# Patient Record
Sex: Female | Born: 1995 | Race: Black or African American | Hispanic: No | Marital: Single | Smoking: Never smoker
Health system: Southern US, Community
[De-identification: ages and names within clinical notes are randomized; demographics above are authoritative.]

## PROBLEM LIST (undated history)

## (undated) DIAGNOSIS — N289 Disorder of kidney and ureter, unspecified: Secondary | ICD-10-CM

## (undated) DIAGNOSIS — B009 Herpesviral infection, unspecified: Secondary | ICD-10-CM

## (undated) HISTORY — PX: BREAST SURGERY: SHX581

## (undated) HISTORY — PX: OTHER SURGICAL HISTORY: SHX169

---
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
## 2015-10-31 ENCOUNTER — Ambulatory Visit (HOSPITAL_COMMUNITY)
Admission: EM | Admit: 2015-10-31 | Discharge: 2015-10-31 | Disposition: A | Discharge status: Home / Self Care | Payer: Managed Care, Other (non HMO) | Attending: Emergency Medicine | Admitting: Emergency Medicine

## 2015-10-31 ENCOUNTER — Encounter (HOSPITAL_COMMUNITY): Payer: Self-pay | Admitting: Emergency Medicine

## 2015-10-31 DIAGNOSIS — J069 Acute upper respiratory infection, unspecified: Secondary | ICD-10-CM

## 2015-10-31 DIAGNOSIS — B9789 Other viral agents as the cause of diseases classified elsewhere: Principal | ICD-10-CM

## 2015-10-31 DIAGNOSIS — R6889 Other general symptoms and signs: Secondary | ICD-10-CM | POA: Diagnosis not present

## 2015-10-31 MED ORDER — HYDROCODONE-HOMATROPINE 5-1.5 MG/5ML PO SYRP: 5.0000 mL | ORAL_SOLUTION | Freq: Four times a day (QID) | ORAL | Status: DC | PRN

## 2015-10-31 NOTE — ED Notes (Signed)
Patient c/o productive cough, hot and cold chills, and bodyaches x 2 days. Patient reports she has been taking theraflu and oTC cold medicine with no relief. Patient is in NAD.

## 2015-10-31 NOTE — ED Provider Notes (Signed)
CSN: [id number]     Arrival date & time 10/31/15  1406 History   First MD Initiated Contact with Patient 10/31/15 [phone]     Chief Complaint  Patient presents with  . URI   (Consider location/radiation/quality/duration/timing/severity/associated sxs/prior Treatment) HPI  She is a 20 year old woman here for evaluation of cough and body aches.  Her symptoms started 2 days ago with sore throat. Of the last 2 days she has developed headaches, body aches, sinus pressure, nasal congestion, and cough. She denies any wheezing or shortness of breath during the day, but states she will feel a little short of breath at night. Decreased appetite, but no nausea or vomiting. She did take ibuprofen today around noon. She did have contact with the sick relative recently.    Past Medical History  Diagnosis Date  . Renal disorder     kidney stone   History reviewed. No pertinent past surgical history. History reviewed. No pertinent family history. Social History  Substance Use Topics  . Smoking status: Never Smoker   . Smokeless tobacco: None  . Alcohol Use: No   OB History    No data available     Review of Systems As in history of present illness Allergies  Review of patient's allergies indicates no known allergies.  Home Medications   Prior to Admission medications   Medication Sig Start Date End Date Taking? Authorizing Provider  HYDROcodone-homatropine (HYCODAN) 5-1.5 MG/5ML syrup Take 5 mLs by mouth every 6 (six) hours as needed for cough. 10/31/15   Charm RingsErin J Avontae Burkhead, MD  lisdexamfetamine (VYVANSE) 30 MG capsule Take 30 mg by mouth daily as needed.     Historical Provider, MD   Meds Ordered and Administered this Visit  Medications - No data to display  BP 113/64 mmHg  Pulse 115  Temp(Src) 100.4 F (38 C) (Oral)  Resp 18  SpO2 100%  LMP 10/01/2015 (Approximate) No data found.   Physical Exam  Constitutional: She is oriented to person, place, and time. She appears well-developed and  well-nourished. No distress.  Appears tired and fatigued  HENT:  Mouth/Throat: Oropharynx is clear and moist. No oropharyngeal exudate.  Nasal mucosa is erythematous. TMs normal bilaterally.  Neck: Neck supple.  Cardiovascular: Regular rhythm and normal heart sounds.   No murmur heard. Mild tachycardia  Pulmonary/Chest: Effort normal and breath sounds normal. No respiratory distress. She has no wheezes. She has no rales.  Lymphadenopathy:    She has cervical adenopathy (left anterior chain).  Neurological: She is alert and oriented to person, place, and time.    ED Course  Procedures (including critical care time)  Labs Review Labs Reviewed - No data to display  Imaging Review No results found.   MDM   1. Viral URI with cough   2. Flu-like symptoms    Likely a flulike illness. Doubt flu at this time. Symptomatic treatment with rest, fluids, OTC allergy medication. Prescription given for Hycodan to use for cough. School note provided. Follow-up if symptoms worsen or do not improve in the next 2 days.    Charm RingsErin J Nekeya Briski, MD 10/31/15 1538

## 2015-10-31 NOTE — Discharge Instructions (Signed)
You have a flulike virus. This is likely not the flu itself. Get plenty of rest and drink lots of fluids. Given over-the-counter allergy medicine such as Zyrtec or Claritin to help with the congestion. Use Hycodan every 4-6 hours as needed for cough. Do not drive while taking this medicine. Ideally, you are not around people for the next 36-48 hours. If your symptoms are not improving by Thursday or you develop worsening shortness of breath, please come back for recheck.

## 2016-03-18 ENCOUNTER — Encounter (HOSPITAL_COMMUNITY): Payer: Self-pay | Admitting: Emergency Medicine

## 2016-03-18 ENCOUNTER — Ambulatory Visit (HOSPITAL_COMMUNITY)
Admission: EM | Admit: 2016-03-18 | Discharge: 2016-03-18 | Disposition: A | Discharge status: Home / Self Care | Payer: Managed Care, Other (non HMO) | Attending: Family Medicine | Admitting: Family Medicine

## 2016-03-18 DIAGNOSIS — J029 Acute pharyngitis, unspecified: Secondary | ICD-10-CM | POA: Insufficient documentation

## 2016-03-18 DIAGNOSIS — J069 Acute upper respiratory infection, unspecified: Secondary | ICD-10-CM

## 2016-03-18 DIAGNOSIS — R52 Pain, unspecified: Secondary | ICD-10-CM | POA: Insufficient documentation

## 2016-03-18 LAB — POCT RAPID STREP A: Streptococcus, Group A Screen (Direct): NEGATIVE

## 2016-03-18 MED ORDER — AZITHROMYCIN 250 MG PO TABS: ORAL_TABLET | ORAL | 0 refills | Status: DC

## 2016-03-18 MED ORDER — IPRATROPIUM BROMIDE 0.06 % NA SOLN: 2.0000 | Freq: Four times a day (QID) | NASAL | 1 refills | Status: DC

## 2016-03-18 NOTE — ED Triage Notes (Signed)
Pt has been suffering from facial pressure, sore throat, chills and generalized body aches since yesterday.

## 2016-03-18 NOTE — ED Provider Notes (Signed)
[facility]    CSN: [id number] Arrival date & time: 03/18/16  1032  First Provider Contact:  First MD Initiated Contact with Patient 03/18/16 1212        History   Chief Complaint Chief Complaint  Patient presents with  . Sore Throat  . Generalized Body Aches  . facial pressure    HPI Katherine Casey is a 20 y.o. female.   The history is provided by the patient.  Sore Throat  This is a new problem. The current episode started yesterday. The problem has been gradually worsening. Pertinent negatives include no chest pain, no abdominal pain, no headaches and no shortness of breath. The symptoms are aggravated by swallowing.    Past Medical History:  Diagnosis Date  . Renal disorder    kidney stone    There are no active problems to display for this patient.   History reviewed. No pertinent surgical history.  OB History    No data available       Home Medications    Prior to Admission medications   Medication Sig Start Date End Date Taking? Authorizing Provider  HYDROcodone-homatropine (HYCODAN) 5-1.5 MG/5ML syrup Take 5 mLs by mouth every 6 (six) hours as needed for cough. 10/31/15   Charm Rings, MD  lisdexamfetamine (VYVANSE) 30 MG capsule Take 30 mg by mouth daily as needed.     Historical Provider, MD    Family History History reviewed. No pertinent family history.  Social History Social History  Substance Use Topics  . Smoking status: Never Smoker  . Smokeless tobacco: Never Used  . Alcohol use Yes     Allergies   Review of patient's allergies indicates no known allergies.   Review of Systems Review of Systems  Constitutional: Positive for chills and fever.  HENT: Positive for congestion, rhinorrhea and sore throat.   Respiratory: Negative for shortness of breath.   Cardiovascular: Negative for chest pain.  Gastrointestinal: Negative for abdominal pain.  Genitourinary: Negative.   Musculoskeletal: Positive for myalgias.    Neurological: Negative for headaches.  All other systems reviewed and are negative.    Physical Exam Triage Vital Signs ED Triage Vitals [03/18/16 1204]  Enc Vitals Group     BP 115/66     Pulse Rate 82     Resp      Temp 99.1 F (37.3 C)     Temp Source Oral     SpO2 100 %     Weight      Height      Head Circumference      Peak Flow      Pain Score 9     Pain Loc      Pain Edu?      Excl. in GC?    No data found.   Updated Vital Signs BP 115/66 (BP Location: Left Arm)   Pulse 82   Temp 99.1 F (37.3 C) (Oral)   SpO2 100%   Visual Acuity Right Eye Distance:   Left Eye Distance:   Bilateral Distance:    Right Eye Near:   Left Eye Near:    Bilateral Near:     Physical Exam  Constitutional: She is oriented to person, place, and time. She appears well-developed and well-nourished. No distress.  HENT:  Right Ear: External ear normal.  Left Ear: External ear normal.  Nose: Rhinorrhea present.  Mouth/Throat: Uvula is midline and mucous membranes are normal. Posterior oropharyngeal edema and posterior oropharyngeal erythema present.  No oropharyngeal exudate or tonsillar abscesses. No tonsillar exudate.  Neck: Normal range of motion. Neck supple.  Cardiovascular: Normal rate, regular rhythm, normal heart sounds and intact distal pulses.   Pulmonary/Chest: Effort normal and breath sounds normal.  Lymphadenopathy:    She has no cervical adenopathy.  Neurological: She is alert and oriented to person, place, and time.  Skin: Skin is warm and dry.  Nursing note and vitals reviewed.    UC Treatments / Results  Labs (all labs ordered are listed, but only abnormal results are displayed) Labs Reviewed - No data to display Strep neg  EKG  EKG Interpretation None       Radiology No results found.  Procedures Procedures (including critical care time)  Medications Ordered in UC Medications - No data to display   Initial Impression / Assessment and  Plan / UC Course  I have reviewed the triage vital signs and the nursing notes.  Pertinent labs & imaging results that were available during my care of the patient were reviewed by me and considered in my medical decision making (see chart for details).  Clinical Course      Final Clinical Impressions(s) / UC Diagnoses   Final diagnoses:  None    New Prescriptions New Prescriptions   No medications on file     Linna HoffJames D Field Staniszewski, MD 03/18/16 1244

## 2016-03-20 LAB — CULTURE, GROUP A STREP (THRC)

## 2016-06-03 ENCOUNTER — Ambulatory Visit (HOSPITAL_COMMUNITY)
Admission: EM | Admit: 2016-06-03 | Discharge: 2016-06-03 | Disposition: A | Discharge status: Home / Self Care | Payer: Managed Care, Other (non HMO) | Attending: Family Medicine | Admitting: Family Medicine

## 2016-06-03 ENCOUNTER — Encounter (HOSPITAL_COMMUNITY): Payer: Self-pay | Admitting: *Deleted

## 2016-06-03 DIAGNOSIS — R6889 Other general symptoms and signs: Secondary | ICD-10-CM

## 2016-06-03 MED ORDER — ONDANSETRON HCL 4 MG/2ML IJ SOLN
INTRAMUSCULAR | Status: AC
Start: 2016-06-03 — End: 2016-06-03
  Filled 2016-06-03: qty 2

## 2016-06-03 MED ORDER — ONDANSETRON 8 MG PO TBDP: 8.0000 mg | ORAL_TABLET | Freq: Three times a day (TID) | ORAL | 0 refills | Status: DC | PRN

## 2016-06-03 MED ORDER — AMOXICILLIN 875 MG PO TABS: 875.0000 mg | ORAL_TABLET | Freq: Two times a day (BID) | ORAL | 0 refills | Status: DC

## 2016-06-03 MED ORDER — ONDANSETRON HCL 4 MG/2ML IJ SOLN
4.0000 mg | Freq: Once | INTRAMUSCULAR | Status: AC
  Administered 2016-06-03: 4 mg via INTRAMUSCULAR

## 2016-06-03 MED ORDER — FLUCONAZOLE 150 MG PO TABS: 150.0000 mg | ORAL_TABLET | Freq: Once | ORAL | 0 refills | Status: AC

## 2016-06-03 NOTE — ED Notes (Signed)
Patient given warm blanket. Patient kept in wheelchair due to complaints of weakness and near syncope. Friend at bedside.

## 2016-06-03 NOTE — ED Notes (Signed)
Recommended patient get on exam table, provided blanket and pillow.  Allowed a few ice chips.

## 2016-06-03 NOTE — Discharge Instructions (Signed)
You need to stay out of work for the next couple days. Rest, take clear liquids when possible, take ibuprofen when the temperature goes up.  This appears to be a flulike illness. Given the fact that you're getting worse over the last week and the cough sputum is change color, we will go ahead and prescribe an antibiotic for the bronchitis.  If you're not getting better in the next 2 days please come back so we can recheck you.

## 2016-06-03 NOTE — ED Triage Notes (Addendum)
Patient reports cold like symptoms x 1 week associated with chills and severe fatigue. Patient reports lower abdominal pain secondary to menstrual period. During exam patient noted to be hyperventilating and cramping of hands. Calming technique used. Patient tearful. Patient states that she doesn't feel well and is hot and cold.

## 2016-06-03 NOTE — ED Provider Notes (Signed)
[facility]    CSN: [id number] Arrival date & time: 06/03/16  1745     History   Chief Complaint Chief Complaint  Patient presents with  . Nasal Congestion  . Abdominal Cramping  . Fatigue    HPI Katherine Casey is a 20 y.o. female.   This is a 20 year old woman who [facility] with 1 week of upper respiratory infection symptoms. She was also on her menstrual period giving her lower abdominal cramps.  When she arrived this evening she started getting nauseated and vomited.   Pulse 109   Temp 98.4 F (36.9 C) (Oral)   Resp 16   LMP 06/03/2016   SpO2 100%        Past Medical History:  Diagnosis Date  . Renal disorder    kidney stone    There are no active problems to display for this patient.   History reviewed. No pertinent surgical history.  OB History    No data available       Home Medications    Prior to Admission medications   Medication Sig Start Date End Date Taking? Authorizing Provider  amoxicillin (AMOXIL) 875 MG tablet Take 1 tablet (875 mg total) by mouth 2 (two) times daily. 06/03/16   Elvina SidleKurt Tome Wilson, MD  fluconazole (DIFLUCAN) 150 MG tablet Take 1 tablet (150 mg total) by mouth once. Repeat if needed 06/03/16 06/03/16  Elvina SidleKurt Kalijah Westfall, MD  ipratropium (ATROVENT) 0.06 % nasal spray Place 2 sprays into both nostrils 4 (four) times daily. 03/18/16   Linna HoffJames D Kindl, MD  ondansetron (ZOFRAN-ODT) 8 MG disintegrating tablet Take 1 tablet (8 mg total) by mouth every 8 (eight) hours as needed for nausea. 06/03/16   Elvina SidleKurt Mahkayla Preece, MD    Family History History reviewed. No pertinent family history.  Social History Social History  Substance Use Topics  . Smoking status: Never Smoker  . Smokeless tobacco: Never Used  . Alcohol use Yes     Allergies   Patient has no known allergies.   Review of Systems Review of Systems  Constitutional: Positive for activity change,  appetite change and fatigue. Negative for fever.  HENT: Positive for congestion and rhinorrhea.   Respiratory: Positive for cough.   Gastrointestinal: Positive for abdominal pain, nausea and vomiting.  Genitourinary: Negative.   Musculoskeletal: Negative.   Neurological: Positive for light-headedness.  Psychiatric/Behavioral: Positive for agitation.     Physical Exam Triage Vital Signs ED Triage Vitals [06/03/16 1824]  Enc Vitals Group     BP      Pulse Rate 109     Resp 16     Temp 98.4 F (36.9 C)     Temp Source Oral     SpO2 100 %     Weight      Height      Head Circumference      Peak Flow      Pain Score 10     Pain Loc      Pain Edu?      Excl. in GC?    No data found.   Updated Vital Signs Pulse 109   Temp 98.4 F (36.9 C) (Oral)   Resp 16   LMP 06/03/2016   SpO2 100%    Physical Exam  Constitutional: She is oriented to person, place, and time. She appears well-developed and well-nourished.  HENT:  Head: Normocephalic.  Right Ear: External ear normal.  Left Ear: External  ear normal.  Mouth/Throat: Oropharynx is clear and moist.  Eyes: Conjunctivae and EOM are normal.  Neck: Normal range of motion. Neck supple.  Pulmonary/Chest: Effort normal.  Abdominal: Soft. Bowel sounds are normal. There is tenderness. There is no rebound and no guarding.  Vomiting in the office.  After given Zofran, patient calmed down and was not nearly as agitated. I was able to palpate her abdomen without eliciting much pain. She seems tender everywhere she is palpated.  Musculoskeletal: Normal range of motion.  Neurological: She is alert and oriented to person, place, and time.  Skin: Skin is warm and dry.  Psychiatric:  The patient is crying on admission.  Nursing note and vitals reviewed.    UC Treatments / Results  Labs (all labs ordered are listed, but only abnormal results are displayed) Labs Reviewed - No data to display  EKG  EKG Interpretation None        Radiology No results found.  Procedures Procedures (including critical care time)  Medications Ordered in UC Medications  ondansetron (ZOFRAN) injection 4 mg (4 mg Intramuscular Given 06/03/16 1846)     Initial Impression / Assessment and Plan / UC Course  I have reviewed the triage vital signs and the nursing notes.  Pertinent labs & imaging results that were available during my care of the patient were reviewed by me and considered in my medical decision making (see chart for details).  Clinical Course       Final Clinical Impressions(s) / UC Diagnoses   Final diagnoses:  Flu-like symptoms    New Prescriptions New Prescriptions   AMOXICILLIN (AMOXIL) 875 MG TABLET    Take 1 tablet (875 mg total) by mouth 2 (two) times daily.   FLUCONAZOLE (DIFLUCAN) 150 MG TABLET    Take 1 tablet (150 mg total) by mouth once. Repeat if needed   ONDANSETRON (ZOFRAN-ODT) 8 MG DISINTEGRATING TABLET    Take 1 tablet (8 mg total) by mouth every 8 (eight) hours as needed for nausea.     Elvina SidleKurt Ebon Ketchum, MD 06/03/16 Windell [id number]

## 2016-08-19 ENCOUNTER — Ambulatory Visit: Payer: Managed Care, Other (non HMO) | Admitting: Obstetrics

## 2016-08-20 ENCOUNTER — Ambulatory Visit: Payer: Managed Care, Other (non HMO) | Admitting: Obstetrics

## 2016-09-25 ENCOUNTER — Encounter (HOSPITAL_BASED_OUTPATIENT_CLINIC_OR_DEPARTMENT_OTHER): Payer: Self-pay | Admitting: *Deleted

## 2016-09-25 ENCOUNTER — Emergency Department (HOSPITAL_COMMUNITY)
Admission: EM | Admit: 2016-09-25 | Discharge: 2016-09-25 | Disposition: A | Discharge status: Home / Self Care | Payer: Managed Care, Other (non HMO) | Source: Home / Self Care

## 2016-09-25 ENCOUNTER — Emergency Department (HOSPITAL_BASED_OUTPATIENT_CLINIC_OR_DEPARTMENT_OTHER)
Admission: EM | Admit: 2016-09-25 | Discharge: 2016-09-25 | Disposition: A | Discharge status: Home / Self Care | Payer: Managed Care, Other (non HMO) | Attending: Emergency Medicine | Admitting: Emergency Medicine

## 2016-09-25 ENCOUNTER — Emergency Department (HOSPITAL_BASED_OUTPATIENT_CLINIC_OR_DEPARTMENT_OTHER): Payer: Managed Care, Other (non HMO)

## 2016-09-25 ENCOUNTER — Encounter (HOSPITAL_COMMUNITY): Payer: Self-pay | Admitting: Emergency Medicine

## 2016-09-25 DIAGNOSIS — R109 Unspecified abdominal pain: Secondary | ICD-10-CM

## 2016-09-25 DIAGNOSIS — Z5321 Procedure and treatment not carried out due to patient leaving prior to being seen by health care provider: Secondary | ICD-10-CM | POA: Insufficient documentation

## 2016-09-25 DIAGNOSIS — R1084 Generalized abdominal pain: Secondary | ICD-10-CM | POA: Diagnosis present

## 2016-09-25 DIAGNOSIS — N201 Calculus of ureter: Secondary | ICD-10-CM | POA: Diagnosis not present

## 2016-09-25 LAB — CBC WITH DIFFERENTIAL/PLATELET
Basophils Absolute: 0 10*3/uL (ref 0.0–0.1)
Basophils Relative: 0 %
Eosinophils Absolute: 0 10*3/uL (ref 0.0–0.7)
Eosinophils Relative: 0 %
HCT: 33.3 % — ABNORMAL LOW (ref 36.0–46.0)
Hemoglobin: 10.9 g/dL — ABNORMAL LOW (ref 12.0–15.0)
Lymphocytes Relative: 19 %
Lymphs Abs: 2.8 10*3/uL (ref 0.7–4.0)
MCH: 24.3 pg — ABNORMAL LOW (ref 26.0–34.0)
MCHC: 32.7 g/dL (ref 30.0–36.0)
MCV: 74.2 fL — ABNORMAL LOW (ref 78.0–100.0)
Monocytes Absolute: 1.5 10*3/uL — ABNORMAL HIGH (ref 0.1–1.0)
Monocytes Relative: 10 %
Neutro Abs: 10.3 10*3/uL — ABNORMAL HIGH (ref 1.7–7.7)
Neutrophils Relative %: 71 %
Platelets: 335 10*3/uL (ref 150–400)
RBC: 4.49 MIL/uL (ref 3.87–5.11)
RDW: 18 % — ABNORMAL HIGH (ref 11.5–15.5)
WBC: 14.6 10*3/uL — ABNORMAL HIGH (ref 4.0–10.5)

## 2016-09-25 LAB — BASIC METABOLIC PANEL
Anion gap: 9 (ref 5–15)
BUN: 15 mg/dL (ref 6–20)
CO2: 24 mmol/L (ref 22–32)
Calcium: 9.1 mg/dL (ref 8.9–10.3)
Chloride: 103 mmol/L (ref 101–111)
Creatinine, Ser: 1.19 mg/dL — ABNORMAL HIGH (ref 0.44–1.00)
GFR calc Af Amer: >60 mL/min (ref >60)
GFR calc non Af Amer: >60 mL/min (ref >60)
Glucose, Bld: 88 mg/dL (ref 65–99)
Potassium: 3.3 mmol/L — ABNORMAL LOW (ref 3.5–5.1)
Sodium: 136 mmol/L (ref 135–145)

## 2016-09-25 LAB — URINALYSIS, ROUTINE W REFLEX MICROSCOPIC
BACTERIA UA: NONE SEEN
BILIRUBIN URINE: NEGATIVE
Glucose, UA: NEGATIVE mg/dL
Ketones, ur: NEGATIVE mg/dL
LEUKOCYTES UA: NEGATIVE
Nitrite: NEGATIVE
Protein, ur: NEGATIVE mg/dL
Specific Gravity, Urine: 1.017 (ref 1.005–1.030)
pH: 7 (ref 5.0–8.0)

## 2016-09-25 LAB — PREGNANCY, URINE: Preg Test, Ur: NEGATIVE

## 2016-09-25 MED ORDER — ONDANSETRON HCL 4 MG/2ML IJ SOLN
4.0000 mg | Freq: Once | INTRAMUSCULAR | Status: AC
  Administered 2016-09-25: 4 mg via INTRAVENOUS
  Filled 2016-09-25: qty 2

## 2016-09-25 MED ORDER — HYDROMORPHONE HCL 1 MG/ML IJ SOLN
INTRAMUSCULAR | Status: AC
  Filled 2016-09-25: qty 1

## 2016-09-25 MED ORDER — PROMETHAZINE HCL 25 MG PO TABS: 25.0000 mg | ORAL_TABLET | Freq: Three times a day (TID) | ORAL | 0 refills | Status: DC | PRN

## 2016-09-25 MED ORDER — OXYCODONE-ACETAMINOPHEN 5-325 MG PO TABS: 1.0000 | ORAL_TABLET | Freq: Four times a day (QID) | ORAL | 0 refills | Status: DC | PRN

## 2016-09-25 MED ORDER — TAMSULOSIN HCL 0.4 MG PO CAPS: 0.4000 mg | ORAL_CAPSULE | Freq: Every day | ORAL | 0 refills | Status: DC

## 2016-09-25 MED ORDER — KETOROLAC TROMETHAMINE 30 MG/ML IJ SOLN
30.0000 mg | Freq: Once | INTRAMUSCULAR | Status: AC
  Administered 2016-09-25: 30 mg via INTRAVENOUS
  Filled 2016-09-25: qty 1

## 2016-09-25 MED ORDER — SODIUM CHLORIDE 0.9 % IV BOLUS (SEPSIS)
1000.0000 mL | Freq: Once | INTRAVENOUS | Status: AC
  Administered 2016-09-25: 1000 mL via INTRAVENOUS

## 2016-09-25 MED ORDER — HYDROMORPHONE HCL 1 MG/ML IJ SOLN
1.0000 mg | Freq: Once | INTRAMUSCULAR | Status: AC
  Administered 2016-09-25: 1 mg via INTRAVENOUS
  Filled 2016-09-25: qty 1

## 2016-09-25 NOTE — ED Notes (Signed)
Urine sample obtained in triage.

## 2016-09-25 NOTE — ED Notes (Signed)
Pt states hurts while urinating

## 2016-09-25 NOTE — ED Triage Notes (Signed)
Per pt, states left flank pain that started around 10 am-no dysuria

## 2016-09-25 NOTE — ED Triage Notes (Signed)
Left flank pain-hx of kidney stones.  Left AMA from [place] ED this afternoon.

## 2016-09-25 NOTE — ED Notes (Signed)
Pt unable to give UA at this time 

## 2016-09-25 NOTE — Discharge Instructions (Signed)
Return here as needed.  Follow up with the urologist provided.  Increase your fluid intake °

## 2016-09-25 NOTE — ED Notes (Signed)
Patient transported to CT 

## 2016-09-25 NOTE — ED Provider Notes (Signed)
[facility]   CSN: [id number] Arrival date & time: 09/25/16  1552  By signing my name below, I, Octavia Heir, attest that this documentation has been prepared under the direction and in the presence of [employer], PA-C.  Electronically Signed: Octavia Heir, ED Scribe. 09/25/16. 4:14 PM.    History   Chief Complaint Chief Complaint  Patient presents with  . Nephrolithiasis   The history is provided by the patient. No language interpreter was used.   HPI Comments: Katherine Casey is a 21 y.o. female who presents to the Emergency Department complaining of acute onset, moderate, left flank pain that began ~ 10 am this morning.  She reports associated nausea. Pt has a hx of kidney stones in the past and notes the last time she had kidney stones was ~ 1 year ago. Pt says her current pain feels very similar to prior kidney stones. She was seen at [facility] earlier today but left AMA due to the long wait. She expresses that she took one hydrocodone this morning to alleviate her pain without relief. Pt has no known drug allergies. She denies dysuria, urinary frequency, or any other complaints.   Past Medical History:  Diagnosis Date  . Renal disorder    kidney stone    There are no active problems to display for this patient.   History reviewed. No pertinent surgical history.  OB History    No data available       Home Medications    Prior to Admission medications   Not on File    Family History History reviewed. No pertinent family history.  Social History Social History  Substance Use Topics  . Smoking status: Never Smoker  . Smokeless tobacco: Never Used  . Alcohol use Yes     Allergies   Patient has no known allergies.   Review of Systems Review of Systems  A complete 10 system review of systems was obtained and all systems are negative except as noted in the HPI and PMH.   Physical Exam Updated Vital Signs BP (!) 138/92 (BP  Location: Left Arm)   Pulse 92   Temp 97.5 F (36.4 C) (Oral)   Resp 20   Ht  (1.6 m)   Wt 145 lb (65.8 kg)   LMP 09/25/2016   SpO2 99%   BMI 25.69 kg/m   Physical Exam  Constitutional: She is oriented to person, place, and time. She appears well-developed and well-nourished.  Appears uncomfortable  HENT:  Head: Normocephalic.  Eyes: EOM are normal.  Neck: Normal range of motion.  Cardiovascular: Normal rate.   Pulmonary/Chest: Effort normal.  Abdominal: She exhibits no distension.  Generalized abdominal tenderness  Musculoskeletal: Normal range of motion.  Neurological: She is alert and oriented to person, place, and time.  Psychiatric: She has a normal mood and affect.  Nursing note and vitals reviewed.    ED Treatments / Results  DIAGNOSTIC STUDIES: Oxygen Saturation is 99% on RA, normal by my interpretation.  COORDINATION OF CARE:  4:12 PM Discussed treatment plan with pt at bedside and pt agreed to plan.  Labs (all labs ordered are listed, but only abnormal results are displayed) Labs Reviewed - No data to display  EKG  EKG Interpretation None       Radiology No results found.  Procedures Procedures (including critical care time)  Medications Ordered in ED Medications - No data to display   Initial Impression / Assessment and Plan / ED  Course  I have reviewed the triage vital signs and the nursing notes.  Pertinent labs & imaging results that were available during my care of the patient were reviewed by me and considered in my medical decision making (see chart for details).     Patient pain is under good control.  The patient is advised to return here as needed.  Patient agrees the plan and all questions were answered.  I did advise she will need to follow-up with urology  Final Clinical Impressions(s) / ED Diagnoses   Final diagnoses:  None   I personally performed the services described in this documentation, which was scribed in my  presence. The recorded information has been reviewed and is accurate. New Prescriptions New Prescriptions   No medications on file     Charlestine Night, PA-C 09/25/16 1954    Loren Racer, MD 09/25/16 2328

## 2016-11-22 ENCOUNTER — Emergency Department (HOSPITAL_BASED_OUTPATIENT_CLINIC_OR_DEPARTMENT_OTHER): Payer: Managed Care, Other (non HMO)

## 2016-11-22 ENCOUNTER — Emergency Department (HOSPITAL_BASED_OUTPATIENT_CLINIC_OR_DEPARTMENT_OTHER)
Admission: EM | Admit: 2016-11-22 | Discharge: 2016-11-22 | Disposition: A | Discharge status: Home / Self Care | Payer: Managed Care, Other (non HMO) | Attending: Emergency Medicine | Admitting: Emergency Medicine

## 2016-11-22 ENCOUNTER — Encounter (HOSPITAL_BASED_OUTPATIENT_CLINIC_OR_DEPARTMENT_OTHER): Payer: Self-pay | Admitting: Adult Health

## 2016-11-22 DIAGNOSIS — N1330 Unspecified hydronephrosis: Secondary | ICD-10-CM | POA: Insufficient documentation

## 2016-11-22 DIAGNOSIS — N23 Unspecified renal colic: Secondary | ICD-10-CM | POA: Insufficient documentation

## 2016-11-22 DIAGNOSIS — R109 Unspecified abdominal pain: Secondary | ICD-10-CM | POA: Diagnosis present

## 2016-11-22 LAB — URINALYSIS, ROUTINE W REFLEX MICROSCOPIC
Bilirubin Urine: NEGATIVE
GLUCOSE, UA: NEGATIVE mg/dL
HGB URINE DIPSTICK: NEGATIVE
Ketones, ur: 15 mg/dL — AB
LEUKOCYTES UA: NEGATIVE
Nitrite: NEGATIVE
PH: 7.5 (ref 5.0–8.0)
Protein, ur: NEGATIVE mg/dL
SPECIFIC GRAVITY, URINE: 1.02 (ref 1.005–1.030)

## 2016-11-22 LAB — CBC WITH DIFFERENTIAL/PLATELET
BASOS ABS: 0 10*3/uL (ref 0.0–0.1)
Basophils Relative: 0 %
EOS PCT: 0 %
Eosinophils Absolute: 0 10*3/uL (ref 0.0–0.7)
HEMATOCRIT: 30 % — AB (ref 36.0–46.0)
Hemoglobin: 9.7 g/dL — ABNORMAL LOW (ref 12.0–15.0)
LYMPHS ABS: 2.8 10*3/uL (ref 0.7–4.0)
LYMPHS PCT: 28 %
MCH: 23.9 pg — ABNORMAL LOW (ref 26.0–34.0)
MCHC: 32.3 g/dL (ref 30.0–36.0)
MCV: 73.9 fL — AB (ref 78.0–100.0)
Monocytes Absolute: 0.8 10*3/uL (ref 0.1–1.0)
Monocytes Relative: 8 %
Neutro Abs: 6.2 10*3/uL (ref 1.7–7.7)
Neutrophils Relative %: 64 %
Platelets: 339 10*3/uL (ref 150–400)
RBC: 4.06 MIL/uL (ref 3.87–5.11)
RDW: 18.8 % — AB (ref 11.5–15.5)
WBC: 9.9 10*3/uL (ref 4.0–10.5)

## 2016-11-22 LAB — BASIC METABOLIC PANEL
Anion gap: 11 (ref 5–15)
BUN: 17 mg/dL (ref 6–20)
CALCIUM: 9 mg/dL (ref 8.9–10.3)
CHLORIDE: 107 mmol/L (ref 101–111)
CO2: 20 mmol/L — AB (ref 22–32)
Creatinine, Ser: 0.72 mg/dL (ref 0.44–1.00)
GFR calc non Af Amer: >60 mL/min (ref >60)
GLUCOSE: 103 mg/dL — AB (ref 65–99)
Potassium: 3.3 mmol/L — ABNORMAL LOW (ref 3.5–5.1)
Sodium: 138 mmol/L (ref 135–145)

## 2016-11-22 MED ORDER — KETOROLAC TROMETHAMINE 30 MG/ML IJ SOLN
30.0000 mg | Freq: Once | INTRAMUSCULAR | Status: AC
Start: 2016-11-22 — End: 2016-11-22
  Administered 2016-11-22: 30 mg via INTRAVENOUS
  Filled 2016-11-22: qty 1

## 2016-11-22 MED ORDER — SODIUM CHLORIDE 0.9 % IV BOLUS (SEPSIS)
500.0000 mL | Freq: Once | INTRAVENOUS | Status: AC
  Administered 2016-11-22: 500 mL via INTRAVENOUS

## 2016-11-22 MED ORDER — DIPHENHYDRAMINE HCL 25 MG PO CAPS: 25.0000 mg | ORAL_CAPSULE | Freq: Once | ORAL | Status: DC

## 2016-11-22 MED ORDER — HYDROMORPHONE HCL 1 MG/ML IJ SOLN
1.0000 mg | Freq: Once | INTRAMUSCULAR | Status: AC
Start: 2016-11-22 — End: 2016-11-22
  Administered 2016-11-22: 1 mg via INTRAVENOUS
  Filled 2016-11-22: qty 1

## 2016-11-22 MED ORDER — HYDROMORPHONE HCL 1 MG/ML IJ SOLN
1.0000 mg | Freq: Once | INTRAMUSCULAR | Status: AC
  Administered 2016-11-22: 1 mg via INTRAVENOUS
  Filled 2016-11-22: qty 1

## 2016-11-22 MED ORDER — TAMSULOSIN HCL 0.4 MG PO CAPS: 0.4000 mg | ORAL_CAPSULE | Freq: Every day | ORAL | 0 refills | Status: DC

## 2016-11-22 MED ORDER — ONDANSETRON HCL 4 MG/2ML IJ SOLN
4.0000 mg | Freq: Once | INTRAMUSCULAR | Status: AC
  Administered 2016-11-22: 4 mg via INTRAVENOUS
  Filled 2016-11-22: qty 2

## 2016-11-22 NOTE — Discharge Instructions (Signed)
Read the information below.  Use the prescribed medication as directed.  Please discuss all new medications with your pharmacist.  You may return to the Emergency Department at any time for worsening condition or any new symptoms that concern you.   If you develop high fevers, worsening abdominal/flank pain, uncontrolled vomiting, or are unable to tolerate fluids by mouth, return to the ER for a recheck.   °

## 2016-11-22 NOTE — ED Notes (Signed)
Patient transported to Ultrasound 

## 2016-11-22 NOTE — ED Provider Notes (Signed)
[facility]   CSN: [id number] Arrival date & time: 11/22/16  1110     History   Chief Complaint Chief Complaint  Patient presents with  . Nephrolithiasis    HPI Katherine Casey is a 21 y.o. female.  HPI   Pt with hx bilateral kidney stones, hx lithotripsy, p/w right flank pain and nausea, chills, urinary frequency/urgency/dysuria that feels exactly like prior ureteral stones.  Pain began suddenly this morning.  Also started menstrual period today, normal and on time.  Denies fevers, abnormal vaginal discharge, bowel changes.  Took phenergan and oxycodone at home without relief.   Has urologist in [place], does not know name.    Past Medical History:  Diagnosis Date  . Renal disorder    kidney stone    There are no active problems to display for this patient.   History reviewed. No pertinent surgical history.  OB History    No data available       Home Medications    Prior to Admission medications   Medication Sig Start Date End Date Taking? Authorizing Provider  oxyCODONE-acetaminophen (PERCOCET/ROXICET) 5-325 MG tablet Take 1 tablet by mouth every 6 (six) hours as needed for severe pain. 09/25/16   Lawyer, Cristal Deer, PA-C  promethazine (PHENERGAN) 25 MG tablet Take 1 tablet (25 mg total) by mouth every 8 (eight) hours as needed for nausea or vomiting. 09/25/16   Lawyer, Cristal Deer, PA-C  tamsulosin (FLOMAX) 0.4 MG CAPS capsule Take 1 capsule (0.4 mg total) by mouth daily. 09/25/16   Lawyer, Cristal Deer, PA-C  tamsulosin (FLOMAX) 0.4 MG CAPS capsule Take 1 capsule (0.4 mg total) by mouth daily. 11/22/16   Trixie Dredge, PA-C    Family History History reviewed. No pertinent family history.  Social History Social History  Substance Use Topics  . Smoking status: Never Smoker  . Smokeless tobacco: Never Used  . Alcohol use Yes     Allergies   Patient has no known allergies.   Review of Systems Review of Systems  All other systems  reviewed and are negative.    Physical Exam Updated Vital Signs BP 107/67 (BP Location: Right Arm)   Pulse 90   Temp 98.4 F (36.9 C) (Oral)   Resp 16   LMP 11/18/2016   SpO2 100%   Physical Exam  Constitutional: She appears well-developed and well-nourished. No distress.  HENT:  Head: Normocephalic and atraumatic.  Neck: Neck supple.  Cardiovascular: Normal rate and regular rhythm.   Pulmonary/Chest: Effort normal and breath sounds normal. No respiratory distress. She has no wheezes. She has no rales.  Abdominal: Soft. She exhibits no distension. There is no tenderness. There is CVA tenderness (right). There is no rebound and no guarding.  Diffuse right sided tenderness  Neurological: She is alert.  Skin: She is not diaphoretic.  Nursing note and vitals reviewed.    ED Treatments / Results  Labs (all labs ordered are listed, but only abnormal results are displayed) Labs Reviewed  URINALYSIS, ROUTINE W REFLEX MICROSCOPIC - Abnormal; Notable for the following:       Result Value   Ketones, ur 15 (*)    All other components within normal limits  CBC WITH DIFFERENTIAL/PLATELET - Abnormal; Notable for the following:    Hemoglobin 9.7 (*)    HCT 30.0 (*)    MCV 73.9 (*)    MCH 23.9 (*)    RDW 18.8 (*)    All other components within normal limits  BASIC METABOLIC PANEL -  Abnormal; Notable for the following:    Potassium 3.3 (*)    CO2 20 (*)    Glucose, Bld 103 (*)    All other components within normal limits  URINE CULTURE    EKG  EKG Interpretation None       Radiology [place] Renal  Result Date: 11/22/2016 CLINICAL DATA:  Patient with right flank pain and nausea. History of renal stones. EXAM: RENAL / URINARY TRACT ULTRASOUND COMPLETE COMPARISON:  CT 09/25/2016 FINDINGS: Right Kidney: Length: 12.4 cm. Moderate right hydronephrosis. Multiple echogenic foci throughout the right kidney suggestive of renal stones, measuring up to 5 mm. No renal mass identified. Left  Kidney: Length: 11.7 cm. Normal renal cortical thickness and echogenicity. Small echogenic focus within the interpolar region of the left kidney suggestive of a small stone. No hydronephrosis. Bladder: The right ureteral jet is not visualized. There is a 3 mm echogenic focus along the posterior right aspect of the urinary bladder. IMPRESSION: Moderate right hydronephrosis. There is a 3 mm echogenic focus along the posterior right aspect of the urinary bladder which may represent a distal ureteral stone. Pelvic phlebolith is an additional consideration given appearance the pelvis on prior CT. Bilateral renal stones, right-greater-than-left. Electronically Signed   By: Annia Beltrew  Davis M.D.   On: 11/22/2016 12:33    Procedures Procedures (including critical care time)  Medications Ordered in ED Medications  diphenhydrAMINE (BENADRYL) capsule 25 mg (not administered)  HYDROmorphone (DILAUDID) injection 1 mg (1 mg Intravenous Given 11/22/16 1133)  ondansetron (ZOFRAN) injection 4 mg (4 mg Intravenous Given 11/22/16 1134)  sodium chloride 0.9 % bolus 500 mL (0 mLs Intravenous Stopped 11/22/16 1449)  ketorolac (TORADOL) 30 MG/ML injection 30 mg (30 mg Intravenous Given 11/22/16 1321)  HYDROmorphone (DILAUDID) injection 1 mg (1 mg Intravenous Given 11/22/16 1322)  sodium chloride 0.9 % bolus 500 mL (0 mLs Intravenous Stopped 11/22/16 1517)     Initial Impression / Assessment and Plan / ED Course  I have reviewed the triage vital signs and the nursing notes.  Pertinent labs & imaging results that were available during my care of the patient were reviewed by me and considered in my medical decision making (see chart for details).  Clinical Course as of Nov 23 1539  Fri Nov 22, 2016  1536 Pt feeling better.  Somewhat itchy from dilaudid.  Pain and nausea controlled.    [EW]    Clinical Course User Index [EW] ChadWest, Mava Suares, [place]PA-C   Afebrile, nontoxic patient with colicky right flank pain c/w prior ureteral stone.   She does have moderate hydronephrosis and probable 3mm distal stone.  UA does not appear infected.  Renal function is normal.  Symptoms improved in ED.  Pt has oxycodone and phenergan in ED.   D/C home with flomax, urology follow up.  Discussed result, findings, treatment, and follow up  with patient.  Pt given return precautions.  Pt verbalizes understanding and agrees with plan.       Final Clinical Impressions(s) / ED Diagnoses   Final diagnoses:  Ureteral colic  Hydronephrosis, unspecified hydronephrosis type    New Prescriptions New Prescriptions   TAMSULOSIN (FLOMAX) 0.4 MG CAPS CAPSULE    Take 1 capsule (0.4 mg total) by mouth daily.     Trixie DredgeWest, Perrie Ragin, PA-C 11/22/16 1541    Blane OharaZavitz, Joshua, MD 11/26/16 [phone]

## 2016-11-22 NOTE — ED Triage Notes (Signed)
Presents with right sided flank pain began this AM, she was unable to pass the last one on her own and required lithotripsy. Took 1 oxycodone this AM when the pain started. Right sided flank pain and nausea, feels the same as last kidney stone.

## 2016-11-22 NOTE — ED Notes (Signed)
Declines being able to urinate at this time, will call nurse when able to give u/a. Call bell at bedside

## 2016-11-22 NOTE — ED Notes (Signed)
Pt unable to give UA at this time 

## 2016-11-22 NOTE — ED Notes (Signed)
Calm, resting quietly on stretcher

## 2016-11-24 LAB — URINE CULTURE: Culture: 30000 — AB

## 2017-06-27 ENCOUNTER — Other Ambulatory Visit: Payer: Self-pay

## 2017-06-27 ENCOUNTER — Ambulatory Visit (HOSPITAL_COMMUNITY)
Admission: EM | Admit: 2017-06-27 | Discharge: 2017-06-27 | Disposition: A | Discharge status: Home / Self Care | Payer: Managed Care, Other (non HMO) | Attending: Emergency Medicine | Admitting: Emergency Medicine

## 2017-06-27 ENCOUNTER — Encounter (HOSPITAL_COMMUNITY): Payer: Self-pay | Admitting: Emergency Medicine

## 2017-06-27 DIAGNOSIS — R05 Cough: Secondary | ICD-10-CM

## 2017-06-27 DIAGNOSIS — J069 Acute upper respiratory infection, unspecified: Secondary | ICD-10-CM

## 2017-06-27 DIAGNOSIS — R0981 Nasal congestion: Secondary | ICD-10-CM | POA: Diagnosis not present

## 2017-06-27 DIAGNOSIS — R059 Cough, unspecified: Secondary | ICD-10-CM

## 2017-06-27 MED ORDER — PREDNISONE 10 MG (21) PO TBPK: ORAL_TABLET | Freq: Every day | ORAL | 0 refills | Status: DC

## 2017-06-27 MED ORDER — AZITHROMYCIN 250 MG PO TABS: 250.0000 mg | ORAL_TABLET | Freq: Every day | ORAL | 0 refills | Status: DC

## 2017-06-27 NOTE — ED Triage Notes (Signed)
Pt c/o nasal congestion, cough, runny nose, sinus issues. Body aches. x2 days

## 2017-06-28 NOTE — ED Provider Notes (Signed)
[facility]    CSN: [id number] Arrival date & time: 06/27/17  1844     History   Chief Complaint Chief Complaint  Patient presents with  . Nasal Congestion  . Cough    HPI Katherine Casey is a 22 y.o. female.   C/o body aches, cough, congestion, and fever for 2 days now. States that she feels as tho he has the flu. Large amount of sinus pressure and nasal drainage. Has taken a few otc meds with no relief      Past Medical History:  Diagnosis Date  . Renal disorder    kidney stone    There are no active problems to display for this patient.   History reviewed. No pertinent surgical history.  OB History    No data available       Home Medications    Prior to Admission medications   Medication Sig Start Date End Date Taking? Authorizing Provider  azithromycin (ZITHROMAX) 250 MG tablet Take 1 tablet (250 mg total) by mouth daily. Take first 2 tablets together, then 1 every day until finished. 06/27/17   Coralyn Mark, NP  oxyCODONE-acetaminophen (PERCOCET/ROXICET) 5-325 MG tablet Take 1 tablet by mouth every 6 (six) hours as needed for severe pain. 09/25/16   Lawyer, Cristal Deer, PA-C  predniSONE (STERAPRED UNI-PAK 21 TAB) 10 MG (21) TBPK tablet Take by mouth daily. Take 6 tabs by mouth daily  for 2 days, then 5 tabs for 2 days, then 4 tabs for 2 days, then 3 tabs for 2 days, 2 tabs for 2 days, then 1 tab by mouth daily for 2 days 06/27/17   Coralyn Mark, NP  promethazine (PHENERGAN) 25 MG tablet Take 1 tablet (25 mg total) by mouth every 8 (eight) hours as needed for nausea or vomiting. 09/25/16   Lawyer, Cristal Deer, PA-C  tamsulosin (FLOMAX) 0.4 MG CAPS capsule Take 1 capsule (0.4 mg total) by mouth daily. 09/25/16   Lawyer, Cristal Deer, PA-C  tamsulosin (FLOMAX) 0.4 MG CAPS capsule Take 1 capsule (0.4 mg total) by mouth daily. 11/22/16   Trixie Dredge, PA-C    Family History No family history on file.  Social History Social History   Tobacco Use  .  Smoking status: Never Smoker  . Smokeless tobacco: Never Used  Substance Use Topics  . Alcohol use: Yes  . Drug use: Yes    Frequency: 7.0 times per week    Types: Marijuana     Allergies   Patient has no known allergies.   Review of Systems Review of Systems  Constitutional: Positive for chills and fatigue.  HENT: Positive for congestion, postnasal drip, rhinorrhea, sinus pressure, sinus pain, sneezing and sore throat.   Eyes: Negative.   Respiratory: Positive for cough.   Cardiovascular: Negative.   Gastrointestinal: Negative.   Genitourinary: Negative.   Neurological: Negative.      Physical Exam Triage Vital Signs ED Triage Vitals  Enc Vitals Group     BP 06/27/17 1905 107/62     Pulse Rate 06/27/17 1905 86     Resp 06/27/17 1905 18     Temp 06/27/17 1905 98.4 F (36.9 C)     Temp src --      SpO2 06/27/17 1905 100 %     Weight --      Height --      Head Circumference --      Peak Flow --      Pain Score 06/27/17 1906 8  Pain Loc --      Pain Edu? --      Excl. in GC? --    No data found.  Updated Vital Signs BP 107/62   Pulse 86   Temp 98.4 F (36.9 C)   Resp 18   LMP 06/20/2017   SpO2 100%   Visual Acuity Right Eye Distance:   Left Eye Distance:   Bilateral Distance:    Right Eye Near:   Left Eye Near:    Bilateral Near:     Physical Exam  Constitutional: She appears well-developed.  HENT:  Head: Normocephalic.  Right Ear: External ear normal.  Left Ear: External ear normal.  Oropharynx erythema,  Pain upon palpation of bil sinus ,   Eyes: Pupils are equal, round, and reactive to light.  Neck: Normal range of motion.  Cardiovascular: Normal rate and regular rhythm.  Pulmonary/Chest: Effort normal and breath sounds normal.  Abdominal: Soft. Bowel sounds are normal.  Neurological: She is alert.  Skin: Skin is warm.     UC Treatments / Results  Labs (all labs ordered are listed, but only abnormal results are displayed) Labs  Reviewed - No data to display  EKG  EKG Interpretation None       Radiology No results found.  Procedures Procedures (including critical care time)  Medications Ordered in UC Medications - No data to display   Initial Impression / Assessment and Plan / UC Course  I have reviewed the triage vital signs and the nursing notes.  Pertinent labs & imaging results that were available during my care of the patient were reviewed by me and considered in my medical decision making (see chart for details).     Use humidifier as needed  May use otc nasal spray  Take tylenol as needed  May take an otc Claritin avoid any that state decongest ion   Final Clinical Impressions(s) / UC Diagnoses   Final diagnoses:  Viral upper respiratory tract infection  Nasal congestion  Cough    ED Discharge Orders        Ordered    predniSONE (STERAPRED UNI-PAK 21 TAB) 10 MG (21) TBPK tablet  Daily     06/27/17 2022    azithromycin (ZITHROMAX) 250 MG tablet  Daily     06/27/17 2022       Controlled Substance Prescriptions [place] Controlled Substance Registry consulted? Not Applicable   Coralyn MarkMitchell, Kathy Wares L, NP 06/28/17 1340

## 2017-06-29 ENCOUNTER — Other Ambulatory Visit: Payer: Self-pay

## 2017-06-29 ENCOUNTER — Encounter (HOSPITAL_COMMUNITY): Payer: Self-pay | Admitting: *Deleted

## 2017-06-29 ENCOUNTER — Ambulatory Visit (HOSPITAL_COMMUNITY)
Admission: EM | Admit: 2017-06-29 | Discharge: 2017-06-29 | Disposition: A | Discharge status: Home / Self Care | Payer: Managed Care, Other (non HMO) | Attending: Urgent Care | Admitting: Urgent Care

## 2017-06-29 DIAGNOSIS — J069 Acute upper respiratory infection, unspecified: Secondary | ICD-10-CM

## 2017-06-29 DIAGNOSIS — R21 Rash and other nonspecific skin eruption: Secondary | ICD-10-CM

## 2017-06-29 DIAGNOSIS — R05 Cough: Secondary | ICD-10-CM

## 2017-06-29 DIAGNOSIS — R059 Cough, unspecified: Secondary | ICD-10-CM

## 2017-06-29 DIAGNOSIS — R0602 Shortness of breath: Secondary | ICD-10-CM

## 2017-06-29 MED ORDER — HYDROXYZINE PAMOATE 50 MG PO CAPS: 50.0000 mg | ORAL_CAPSULE | Freq: Three times a day (TID) | ORAL | 0 refills | Status: DC | PRN

## 2017-06-29 MED ORDER — HYDROCODONE-HOMATROPINE 5-1.5 MG/5ML PO SYRP: 5.0000 mL | ORAL_SOLUTION | Freq: Every evening | ORAL | 0 refills | Status: DC | PRN

## 2017-06-29 MED ORDER — ALBUTEROL SULFATE HFA 108 (90 BASE) MCG/ACT IN AERS: 1.0000 | INHALATION_SPRAY | Freq: Four times a day (QID) | RESPIRATORY_TRACT | 0 refills | Status: AC | PRN

## 2017-06-29 MED ORDER — BENZONATATE 100 MG PO CAPS: 100.0000 mg | ORAL_CAPSULE | Freq: Three times a day (TID) | ORAL | 0 refills | Status: DC | PRN

## 2017-06-29 NOTE — ED Provider Notes (Signed)
  MRN: [id number] DOB: 09-03-1995  Subjective:   Katherine Casey is a 22 y.o. female presenting for 1 day history of rash over her left flank. Patient was recently seen at our clinic on 06/27/2017, started on prednisone taper and azithromycin. Reports that she developed very pruritic rash over left flank. Denies fever, chest pain, chest tightness, facial swelling, oral swelling, n/v, abdominal pain. She continues to have productive cough, shob, wheezing, sinus congestion. Denies history of allergic reactions. Has also been taking Adderall for the past month on an as needed basis, ~3 times per week. Denies smoking cigarettes.  Katherine Casey has No Known Allergies.  Katherine Casey  has a past medical history of Renal disorder. Denies past surgical history.  Objective:   Vitals: BP (!) 113/58   Pulse 95   Temp 98.5 F (36.9 C) (Oral)   Resp 18   LMP 06/20/2017 (Approximate)   SpO2 100%   Physical Exam  Constitutional: She is oriented to person, place, and time. She appears well-developed and well-nourished.  HENT:  Mouth/Throat: Oropharynx is clear and moist.  Nasal passage minimally patent with thick mucus. Airway patent.  Eyes: Right eye exhibits no discharge. Left eye exhibits no discharge.  Neck: Normal range of motion. Neck supple.  Cardiovascular: Normal rate, regular rhythm and intact distal pulses. Exam reveals no gallop and no friction rub.  No murmur heard. Pulmonary/Chest: No respiratory distress. She has no wheezes. She has no rales.  Lymphadenopathy:    She has no cervical adenopathy.  Neurological: She is alert and oriented to person, place, and time.  Skin: Skin is warm and dry. Rash (~4 urticarial type lesions over her left flank that are very pruritic, not draining, bleeding) noted.  Psychiatric: She has a normal mood and affect.   Assessment and Plan :   Upper respiratory tract infection, unspecified type  Cough  Shortness of breath  Rash   Will have patient stop azithromycin.  Hold off on Adderall. Maintain prednisone, add albuterol inhaler, cough suppression medications. Use Vistaril for itching and possible drug rash. Strict ER and return-to-clinic precautions discussed, patient verbalized understanding.    Wallis BambergMani, Tadarrius Burch, [place] JerseyPA-C 06/29/17 1909

## 2017-06-29 NOTE — ED Triage Notes (Signed)
C/O runny nose, congestion x 4 days.  C/O bumpy rash to left side with slight itching since last night.

## 2017-09-01 ENCOUNTER — Emergency Department (HOSPITAL_COMMUNITY): Payer: Managed Care, Other (non HMO)

## 2017-09-01 ENCOUNTER — Emergency Department (HOSPITAL_COMMUNITY)
Admission: EM | Admit: 2017-09-01 | Discharge: 2017-09-01 | Disposition: A | Discharge status: Home / Self Care | Payer: Managed Care, Other (non HMO) | Attending: Emergency Medicine | Admitting: Emergency Medicine

## 2017-09-01 ENCOUNTER — Encounter (HOSPITAL_COMMUNITY): Payer: Self-pay

## 2017-09-01 DIAGNOSIS — F1210 Cannabis abuse, uncomplicated: Secondary | ICD-10-CM | POA: Insufficient documentation

## 2017-09-01 DIAGNOSIS — N1330 Unspecified hydronephrosis: Secondary | ICD-10-CM | POA: Insufficient documentation

## 2017-09-01 DIAGNOSIS — N23 Unspecified renal colic: Secondary | ICD-10-CM

## 2017-09-01 DIAGNOSIS — N200 Calculus of kidney: Secondary | ICD-10-CM | POA: Diagnosis not present

## 2017-09-01 DIAGNOSIS — R1031 Right lower quadrant pain: Secondary | ICD-10-CM | POA: Diagnosis present

## 2017-09-01 LAB — URINALYSIS, ROUTINE W REFLEX MICROSCOPIC
Bilirubin Urine: NEGATIVE
Glucose, UA: NEGATIVE mg/dL
KETONES UR: NEGATIVE mg/dL
Leukocytes, UA: NEGATIVE
Nitrite: NEGATIVE
PROTEIN: NEGATIVE mg/dL
Specific Gravity, Urine: 1.025 (ref 1.005–1.030)
pH: 6 (ref 5.0–8.0)

## 2017-09-01 LAB — CBC WITH DIFFERENTIAL/PLATELET
Basophils Absolute: 0 10*3/uL (ref 0.0–0.1)
Basophils Relative: 0 %
Eosinophils Absolute: 0.1 10*3/uL (ref 0.0–0.7)
Eosinophils Relative: 1 %
HCT: 32.3 % — ABNORMAL LOW (ref 36.0–46.0)
Hemoglobin: 10.1 g/dL — ABNORMAL LOW (ref 12.0–15.0)
LYMPHS ABS: 4.5 10*3/uL — AB (ref 0.7–4.0)
LYMPHS PCT: 38 %
MCH: 23.7 pg — AB (ref 26.0–34.0)
MCHC: 31.3 g/dL (ref 30.0–36.0)
MCV: 75.8 fL — AB (ref 78.0–100.0)
MONO ABS: 1.1 10*3/uL — AB (ref 0.1–1.0)
MONOS PCT: 9 %
Neutro Abs: 6.4 10*3/uL (ref 1.7–7.7)
Neutrophils Relative %: 52 %
PLATELETS: 338 10*3/uL (ref 150–400)
RBC: 4.26 MIL/uL (ref 3.87–5.11)
RDW: 18.2 % — AB (ref 11.5–15.5)
WBC: 12.1 10*3/uL — ABNORMAL HIGH (ref 4.0–10.5)

## 2017-09-01 LAB — I-STAT BETA HCG BLOOD, ED (MC, WL, AP ONLY): I-stat hCG, quantitative: <5 m[IU]/mL (ref <5)

## 2017-09-01 LAB — BASIC METABOLIC PANEL
Anion gap: 6 (ref 5–15)
BUN: 24 mg/dL — AB (ref 6–20)
CO2: 26 mmol/L (ref 22–32)
CREATININE: 0.69 mg/dL (ref 0.44–1.00)
Calcium: 9.2 mg/dL (ref 8.9–10.3)
Chloride: 107 mmol/L (ref 101–111)
GFR calc Af Amer: >60 mL/min (ref >60)
GLUCOSE: 92 mg/dL (ref 65–99)
Potassium: 3.7 mmol/L (ref 3.5–5.1)
Sodium: 139 mmol/L (ref 135–145)

## 2017-09-01 MED ORDER — HYDROMORPHONE HCL 1 MG/ML IJ SOLN
0.5000 mg | Freq: Once | INTRAMUSCULAR | Status: AC
  Administered 2017-09-01: 0.5 mg via INTRAVENOUS
  Filled 2017-09-01: qty 1

## 2017-09-01 MED ORDER — OXYCODONE-ACETAMINOPHEN 5-325 MG PO TABS: 1.0000 | ORAL_TABLET | ORAL | 0 refills | Status: DC | PRN

## 2017-09-01 MED ORDER — OXYCODONE-ACETAMINOPHEN 5-325 MG PO TABS
1.0000 | ORAL_TABLET | Freq: Once | ORAL | Status: AC
  Administered 2017-09-01: 1 via ORAL
  Filled 2017-09-01: qty 1

## 2017-09-01 MED ORDER — ONDANSETRON HCL 4 MG/2ML IJ SOLN
4.0000 mg | Freq: Once | INTRAMUSCULAR | Status: AC
  Administered 2017-09-01: 4 mg via INTRAVENOUS
  Filled 2017-09-01: qty 2

## 2017-09-01 NOTE — ED Provider Notes (Signed)
[facility]EM[facility]GENCY [facility]PT Provider Note   CSN: [id number] Arrival date & time: 09/01/17  0209     History   Chief Complaint Chief Complaint  Patient presents with  . Flank Pain    HPI   Blood pressure 117/74, pulse (!) 106, temperature 98.7 F (37.1 C), temperature source Oral, resp. rate 16, last menstrual period 08/20/2017, SpO2 98 %.  Katherine Casey is a 22 y.o. female complaining of severe right flank pain onset earlier in the day with tactile fever, initially it was in the flank and now is radiating down to the abdomen.  She had multiple kidney stones in the past, she took oxycodone at home with little relief.  She is required intervention to pass most of her stones, she follows that urology in [place].  She lives in [place] but her family is in [place].  She denies dysuria, hematuria, urinary frequency or sensation of incomplete void.  Past Medical History:  Diagnosis Date  . Renal disorder    kidney stone    There are no active problems to display for this patient.   History reviewed. No pertinent surgical history.  OB History    No data available       Home Medications    Prior to Admission medications   Medication Sig Start Date End Date Taking? Authorizing Provider  albuterol (PROVENTIL HFA;VENTOLIN HFA) 108 (90 Base) MCG/ACT inhaler Inhale 1-2 puffs into the lungs every 6 (six) hours as needed for wheezing or shortness of breath. 06/29/17   Wallis Bamberg, PA-C  Amphetamine-Dextroamphetamine (ADDERALL PO) Take by mouth.    [provider]  benzonatate (TESSALON) 100 MG capsule Take 1-2 capsules (100-200 mg total) by mouth 3 (three) times daily as needed for cough. 06/29/17   Wallis Bamberg, PA-C  HYDROcodone-homatropine [facility]) 5-1.5 MG/5ML syrup Take 5 mLs by mouth at bedtime as needed. 06/29/17   Wallis Bamberg, PA-C  hydrOXYzine (VISTARIL) 50 MG capsule Take 1 capsule (50 mg total) by mouth 3 (three) times daily as needed. 06/29/17    Wallis Bamberg, PA-C  oxyCODONE-acetaminophen (PERCOCET) 5-325 MG tablet Take 1 tablet by mouth every 4 (four) hours as needed. 09/01/17   Buddy Loeffelholz, Joni Reining, PA-C  predniSONE (STERAPRED UNI-PAK 21 TAB) 10 MG (21) TBPK tablet Take by mouth daily. Take 6 tabs by mouth daily  for 2 days, then 5 tabs for 2 days, then 4 tabs for 2 days, then 3 tabs for 2 days, 2 tabs for 2 days, then 1 tab by mouth daily for 2 days 06/27/17   Coralyn Mark, NP  promethazine (PHENERGAN) 25 MG tablet Take 1 tablet (25 mg total) by mouth every 8 (eight) hours as needed for nausea or vomiting. 09/25/16   Lawyer, Cristal Deer, PA-C  tamsulosin (FLOMAX) 0.4 MG CAPS capsule Take 1 capsule (0.4 mg total) by mouth daily. 09/25/16   Lawyer, Cristal Deer, PA-C  tamsulosin (FLOMAX) 0.4 MG CAPS capsule Take 1 capsule (0.4 mg total) by mouth daily. 11/22/16   Trixie Dredge, PA-C    Family History History reviewed. No pertinent family history.  Social History Social History   Tobacco Use  . Smoking status: Never Smoker  . Smokeless tobacco: Never Used  Substance Use Topics  . Alcohol use: Yes    Comment: occasionally  . Drug use: Yes    Frequency: 7.0 times per week    Types: Marijuana     Allergies   Azithromycin   Review of Systems Review of Systems  A complete review  of systems was obtained and all systems are negative except as noted in the HPI and PMH.   Physical Exam Updated Vital Signs BP 115/74 (BP Location: Right Arm)   Pulse 67   Temp 98.4 F (36.9 C) (Oral)   Resp 14   LMP 08/20/2017 Comment: negative beta HCG 09/01/17  SpO2 99%   Physical Exam  Constitutional: She is oriented to person, place, and time. She appears well-developed and well-nourished. No distress.  HENT:  Head: Normocephalic and atraumatic.  Mouth/Throat: Oropharynx is clear and moist.  Eyes: Conjunctivae and EOM are normal. Pupils are equal, round, and reactive to light.  Neck: Normal range of motion.  Cardiovascular: Normal rate,  regular rhythm and intact distal pulses.  Pulmonary/Chest: Effort normal and breath sounds normal.  Abdominal: Soft. There is no tenderness.  Musculoskeletal: Normal range of motion.  Neurological: She is alert and oriented to person, place, and time.  Skin: She is not diaphoretic.  Psychiatric: She has a normal mood and affect.  Nursing note and vitals reviewed.    ED Treatments / Results  Labs (all labs ordered are listed, but only abnormal results are displayed) Labs Reviewed  CBC WITH DIFFERENTIAL/PLATELET - Abnormal; Notable for the following components:      Result Value   WBC 12.1 (*)    Hemoglobin 10.1 (*)    HCT 32.3 (*)    MCV 75.8 (*)    MCH 23.7 (*)    RDW 18.2 (*)    Lymphs Abs 4.5 (*)    Monocytes Absolute 1.1 (*)    All other components within normal limits  BASIC METABOLIC PANEL - Abnormal; Notable for the following components:   BUN 24 (*)    All other components within normal limits  URINALYSIS, ROUTINE W REFLEX MICROSCOPIC - Abnormal; Notable for the following components:   Hgb urine dipstick LARGE (*)    Bacteria, UA RARE (*)    Squamous Epithelial / LPF 0-5 (*)    All other components within normal limits  I-STAT BETA HCG BLOOD, ED ([facility], [facility], [facility] ONLY)    EKG  EKG Interpretation None       Radiology Ct Renal Stone Study  Result Date: 09/01/2017 CLINICAL DATA:  Acute onset of right flank pain and leukocytosis. Microhematuria. EXAM: CT ABDOMEN AND PELVIS WITHOUT CONTRAST TECHNIQUE: Multidetector CT imaging of the abdomen and pelvis was performed following the standard protocol without IV contrast. COMPARISON:  CT of the abdomen and pelvis performed 09/25/2016, and renal ultrasound performed 11/22/2016 FINDINGS: Lower chest: The minimally visualized lung bases are clear. The visualized portions of the mediastinum are grossly unremarkable. Hepatobiliary: The visualized portions of the liver are unremarkable. The gallbladder is unremarkable in appearance.  The common bile duct remains normal in caliber. Pancreas: The pancreas is within normal limits. Spleen: The spleen is unremarkable in appearance. Adrenals/Urinary Tract: A 3 mm stone is noted at the right side of the base of the bladder, likely at the right vesicoureteral junction. There is minimal right-sided hydronephrosis. Scattered nonobstructing bilateral renal stones measure up to 2 mm in size. No perinephric stranding is appreciated. Stomach/Bowel: The stomach is unremarkable in appearance. The small bowel is within normal limits. The appendix is normal in caliber, without evidence of appendicitis. The colon is unremarkable in appearance. Vascular/Lymphatic: The abdominal aorta is unremarkable in appearance. The inferior vena cava is grossly unremarkable. No retroperitoneal lymphadenopathy is seen. No pelvic sidewall lymphadenopathy is identified. Reproductive: The bladder is mildly distended and otherwise unremarkable.  The uterus is grossly unremarkable. The ovaries are relatively symmetric. No suspicious adnexal masses are seen. Other: No additional soft tissue abnormalities are seen. Musculoskeletal: No acute osseous abnormalities are identified. The visualized musculature is unremarkable in appearance. IMPRESSION: 1. 3 mm stone noted at the right side of the base of the bladder, likely at the right vesicoureteral junction. Minimal associated right-sided hydronephrosis. 2. Scattered tiny nonobstructing bilateral renal stones measure up to 2 mm in size. Electronically Signed   By: Roanna RaiderJeffery  Chang M.D.   On: 09/01/2017 04:19    Procedures Procedures (including critical care time)  Medications Ordered in ED Medications  HYDROmorphone (DILAUDID) injection 0.5 mg (0.5 mg Intravenous Given 09/01/17 0330)  ondansetron (ZOFRAN) injection 4 mg (4 mg Intravenous Given 09/01/17 0330)     Initial Impression / Assessment and Plan / ED Course  I have reviewed the triage vital signs and the nursing  notes.  Pertinent labs & imaging results that were available during my care of the patient were reviewed by me and considered in my medical decision making (see chart for details).     Vitals:   09/01/17 0214 09/01/17 0333  BP: 117/74 115/74  Pulse: (!) 106 67  Resp: 16 14  Temp: 98.7 F (37.1 C) 98.4 F (36.9 C)  TempSrc: Oral Oral  SpO2: 98% 99%    Medications  HYDROmorphone (DILAUDID) injection 0.5 mg (0.5 mg Intravenous Given 09/01/17 0330)  ondansetron (ZOFRAN) injection 4 mg (4 mg Intravenous Given 09/01/17 0330)    Deysha Clovis RileyMitchell is 22 y.o. female presenting with right flank pain consistent with prior episodes of renal colic.  She has not had to have multiple interventions to passed her stones in the past.  She is reporting fever but she is afebrile here, mildly tachycardic.  Blood work reassuring with no elevated addition in creatinine.  Urinalysis is not consistent with infection.  CT reveals 3 mm stone distally.  Patient advised to follow closely with urology  Evaluation does not show pathology that would require ongoing emergent intervention or inpatient treatment. Pt is hemodynamically stable and mentating appropriately. Discussed findings and plan with patient/guardian, who agrees with care plan. All questions answered. Return precautions discussed and outpatient follow up given.      Final Clinical Impressions(s) / ED Diagnoses   Final diagnoses:  Renal colic    ED Discharge Orders        Ordered    oxyCODONE-acetaminophen (PERCOCET) 5-325 MG tablet  Every 4 hours PRN     09/01/17 0446       Enes Wegener, Joni Reiningicole, PA-C 09/01/17 0449    Molpus, Jonny RuizJohn, MD 09/01/17 [id number]

## 2017-09-01 NOTE — ED Notes (Signed)
Returned from CT.

## 2017-09-01 NOTE — ED Triage Notes (Signed)
Pt complains of right flank pain since yesterday, she has hx of kidney stones and last had surgery in the fall

## 2017-09-01 NOTE — Discharge Instructions (Signed)
Take percocet for breakthrough pain, do not drink alcohol, drive, care for children or do other critical tasks while taking percocet.  Please make an appointment with your urologist as soon as possible.  Do not hesitate to return to the emergency department for any new or worsening symptoms.

## 2018-01-24 ENCOUNTER — Emergency Department (HOSPITAL_COMMUNITY)
Admission: EM | Admit: 2018-01-24 | Discharge: 2018-01-24 | Disposition: A | Discharge status: Home / Self Care | Payer: Managed Care, Other (non HMO) | Attending: Emergency Medicine | Admitting: Emergency Medicine

## 2018-01-24 ENCOUNTER — Other Ambulatory Visit: Payer: Self-pay

## 2018-01-24 ENCOUNTER — Emergency Department (HOSPITAL_COMMUNITY): Payer: Managed Care, Other (non HMO)

## 2018-01-24 ENCOUNTER — Encounter (HOSPITAL_COMMUNITY): Payer: Self-pay

## 2018-01-24 DIAGNOSIS — Y93E2 Activity, laundry: Secondary | ICD-10-CM | POA: Insufficient documentation

## 2018-01-24 DIAGNOSIS — W109XXA Fall (on) (from) unspecified stairs and steps, initial encounter: Secondary | ICD-10-CM | POA: Insufficient documentation

## 2018-01-24 DIAGNOSIS — Y999 Unspecified external cause status: Secondary | ICD-10-CM | POA: Diagnosis not present

## 2018-01-24 DIAGNOSIS — S93402A Sprain of unspecified ligament of left ankle, initial encounter: Secondary | ICD-10-CM | POA: Diagnosis not present

## 2018-01-24 DIAGNOSIS — Z79899 Other long term (current) drug therapy: Secondary | ICD-10-CM | POA: Insufficient documentation

## 2018-01-24 DIAGNOSIS — Y929 Unspecified place or not applicable: Secondary | ICD-10-CM | POA: Diagnosis not present

## 2018-01-24 DIAGNOSIS — S99912A Unspecified injury of left ankle, initial encounter: Secondary | ICD-10-CM | POA: Diagnosis present

## 2018-01-24 NOTE — Discharge Instructions (Signed)

## 2018-01-24 NOTE — ED Triage Notes (Signed)
Se states that, as she was carrying laundry, she misstepped while descending stairs, causing her to trip and fall. She c/o pain and swelling left ankle.

## 2018-01-24 NOTE — ED Provider Notes (Signed)
[facility]-EMERGENCY DEPT Provider Note   CSN: [id number] Arrival date & time: 01/24/18  1550     History   Chief Complaint Chief Complaint  Patient presents with  . Ankle Pain    HPI Katherine Casey is a 22 y.o. female.  HPI   Pt is a 22 y/o female with a h/o kidney stones who presents to the Ed today c/o left ankle and foot pain that began after she misstepped on the stairs and rolled her ankle while walking down the stairs. States that she fell down 3 stairs. Denies any other injuries from the fall including no head trauma or LOC. No neck, back, or abd pain.   States pain started suddenly. States that it is constant severe pain that is rated 10/10. Feels like a stabbing/aching pain. Pain is worse with movement. Has not tried taking any medications for her sxs.  Past Medical History:  Diagnosis Date  . Renal disorder    kidney stone    There are no active problems to display for this patient.   No past surgical history on file.   OB History   None      Home Medications    Prior to Admission medications   Medication Sig Start Date End Date Taking? Authorizing Provider  albuterol (PROVENTIL HFA;VENTOLIN HFA) 108 (90 Base) MCG/ACT inhaler Inhale 1-2 puffs into the lungs every 6 (six) hours as needed for wheezing or shortness of breath. 06/29/17   Wallis Bamberg, PA-C  Amphetamine-Dextroamphetamine (ADDERALL PO) Take by mouth.    [provider]  benzonatate (TESSALON) 100 MG capsule Take 1-2 capsules (100-200 mg total) by mouth 3 (three) times daily as needed for cough. 06/29/17   Wallis Bamberg, PA-C  HYDROcodone-homatropine [facility]) 5-1.5 MG/5ML syrup Take 5 mLs by mouth at bedtime as needed. 06/29/17   Wallis Bamberg, PA-C  hydrOXYzine (VISTARIL) 50 MG capsule Take 1 capsule (50 mg total) by mouth 3 (three) times daily as needed. 06/29/17   Wallis Bamberg, PA-C  oxyCODONE-acetaminophen (PERCOCET) 5-325 MG tablet Take 1 tablet by mouth every 4 (four) hours  as needed. 09/01/17   Pisciotta, Joni Reining, PA-C  predniSONE (STERAPRED UNI-PAK 21 TAB) 10 MG (21) TBPK tablet Take by mouth daily. Take 6 tabs by mouth daily  for 2 days, then 5 tabs for 2 days, then 4 tabs for 2 days, then 3 tabs for 2 days, 2 tabs for 2 days, then 1 tab by mouth daily for 2 days 06/27/17   Coralyn Mark, NP  promethazine (PHENERGAN) 25 MG tablet Take 1 tablet (25 mg total) by mouth every 8 (eight) hours as needed for nausea or vomiting. 09/25/16   Lawyer, Cristal Deer, PA-C  tamsulosin (FLOMAX) 0.4 MG CAPS capsule Take 1 capsule (0.4 mg total) by mouth daily. 09/25/16   Lawyer, Cristal Deer, PA-C  tamsulosin (FLOMAX) 0.4 MG CAPS capsule Take 1 capsule (0.4 mg total) by mouth daily. 11/22/16   Trixie Dredge, PA-C    Family History No family history on file.  Social History Social History   Tobacco Use  . Smoking status: Never Smoker  . Smokeless tobacco: Never Used  Substance Use Topics  . Alcohol use: Yes    Comment: occasionally  . Drug use: Yes    Frequency: 7.0 times per week    Types: Marijuana     Allergies   Azithromycin   Review of Systems Review of Systems  Constitutional: Negative for fever.  Gastrointestinal: Negative for abdominal pain.  Musculoskeletal: Negative  for back pain and neck pain.       Left ankle/foot pain  Skin: Negative for wound.  Neurological: Negative for weakness and numbness.       No head trauma or loc    Physical Exam Updated Vital Signs BP 135/82 (BP Location: Left Arm)   Pulse (!) 120   Temp 99.1 F (37.3 C) (Oral)   Resp (!) 24   Ht 5\' 3"  (1.6 m)   Wt 63.5 kg (140 lb)   LMP 01/12/2018 (Approximate)   SpO2 100%   BMI 24.80 kg/m   Physical Exam  Constitutional: She appears well-developed and well-nourished. No distress.  HENT:  Head: Normocephalic and atraumatic.  Eyes: Conjunctivae are normal.  Neck: Neck supple.  Cardiovascular: Normal rate.  Pulmonary/Chest: Effort normal.  Abdominal: Soft. There is no  tenderness.  Musculoskeletal:  Noticed to palpation to the medial and lateral malleoli of the left ankle.  More so on the left malleolus.  Tenderness throughout the lateral hindfoot and midfoot with overlying swelling and mild ecchymosis.  Decreased range of motion due to pain.  Neurological: She is alert.  Skin: Skin is warm and dry.  Psychiatric: She has a normal mood and affect.  Nursing note and vitals reviewed.   ED Treatments / Results  Labs (all labs ordered are listed, but only abnormal results are displayed) Labs Reviewed - No data to display  EKG None  Radiology Dg Ankle Complete Left  Result Date: 01/24/2018 CLINICAL DATA:  Fall downstairs with ankle pain, initial encounter EXAM: LEFT ANKLE COMPLETE - 3+ VIEW COMPARISON:  None. FINDINGS: There is no evidence of fracture, dislocation, or joint effusion. There is no evidence of arthropathy or other focal bone abnormality. Soft tissues are unremarkable. IMPRESSION: No acute abnormality noted. Electronically Signed   By: Alcide CleverMark  Lukens M.D.   On: 01/24/2018 16:30   Dg Foot Complete Left  Result Date: 01/24/2018 CLINICAL DATA:  Fall, left ankle pain/swelling EXAM: LEFT FOOT - COMPLETE 3+ VIEW COMPARISON:  None. FINDINGS: No fracture or dislocation is seen. The joint spaces are preserved. The visualized soft tissues are unremarkable. IMPRESSION: Negative. Electronically Signed   By: Charline BillsSriyesh  Krishnan M.D.   On: 01/24/2018 18:13    Procedures Procedures (including critical care time) SPLINT APPLICATION Date/Time: 6:43 PM Authorized by: Karrie Meresortni S Stokely Jeancharles Consent: Verbal consent obtained. Risks and benefits: risks, benefits and alternatives were discussed Consent given by: patient Splint applied by: orthopedic technician Location details: LLE Splint type: ASO ankle splint Post-procedure: The splinted body part was neurovascularly unchanged following the procedure. Patient tolerance: Patient tolerated the procedure well with no  immediate complications.  Medications Ordered in ED Medications - No data to display   Initial Impression / Assessment and Plan / ED Course  I have reviewed the triage vital signs and the nursing notes.  Pertinent labs & imaging results that were available during my care of the patient were reviewed by me and considered in my medical decision making (see chart for details).     Final Clinical Impressions(s) / ED Diagnoses   Final diagnoses:  Sprain of left ankle, unspecified ligament, initial encounter   Patient presenting with ankle pain after twisting ankle prior to arrival.  Vital signs initially with tachycardia however this improved upon discharge.  Likely secondary to pain.  Patient stable and nontoxic-appearing.  X-ray of left foot and ankle negative for acute fracture abnormality.  A splint was applied and crutches given.  PCP follow-up given and patient advised  to follow-up with PCP or in 1 week for reevaluation.  Advised Tylenol, ibuprofen, and rice protocol for pain.  Advised to return to the ER for any new or worsening symptoms in the meantime.  All questions were answered and patient understands plan and reasons to return.   ED Discharge Orders    None       Karrie Meres, PA-C 01/24/18 1843    Melene Plan, DO 01/24/18 2330

## 2018-09-10 ENCOUNTER — Emergency Department (HOSPITAL_COMMUNITY): Payer: Managed Care, Other (non HMO)

## 2018-09-10 ENCOUNTER — Emergency Department (HOSPITAL_COMMUNITY)
Admission: EM | Admit: 2018-09-10 | Discharge: 2018-09-10 | Disposition: A | Discharge status: Home / Self Care | Payer: Managed Care, Other (non HMO) | Attending: Emergency Medicine | Admitting: Emergency Medicine

## 2018-09-10 ENCOUNTER — Other Ambulatory Visit: Payer: Self-pay

## 2018-09-10 ENCOUNTER — Encounter (HOSPITAL_COMMUNITY): Payer: Self-pay

## 2018-09-10 DIAGNOSIS — J069 Acute upper respiratory infection, unspecified: Secondary | ICD-10-CM | POA: Diagnosis not present

## 2018-09-10 DIAGNOSIS — Z79899 Other long term (current) drug therapy: Secondary | ICD-10-CM | POA: Diagnosis not present

## 2018-09-10 DIAGNOSIS — R05 Cough: Secondary | ICD-10-CM | POA: Diagnosis present

## 2018-09-10 HISTORY — DX: Herpesviral infection, unspecified: B00.9

## 2018-09-10 LAB — COMPREHENSIVE METABOLIC PANEL
ALBUMIN: 4.4 g/dL (ref 3.5–5.0)
ALT: 22 U/L (ref 0–44)
ANION GAP: 7 (ref 5–15)
AST: 24 U/L (ref 15–41)
Alkaline Phosphatase: 75 U/L (ref 38–126)
BUN: 12 mg/dL (ref 6–20)
CHLORIDE: 106 mmol/L (ref 98–111)
CO2: 22 mmol/L (ref 22–32)
CREATININE: 0.57 mg/dL (ref 0.44–1.00)
Calcium: 9.1 mg/dL (ref 8.9–10.3)
GFR calc Af Amer: >60 mL/min (ref >60)
Glucose, Bld: 90 mg/dL (ref 70–99)
Potassium: 3.5 mmol/L (ref 3.5–5.1)
Sodium: 135 mmol/L (ref 135–145)
Total Bilirubin: 0.6 mg/dL (ref 0.3–1.2)
Total Protein: 8 g/dL (ref 6.5–8.1)

## 2018-09-10 LAB — CBC WITH DIFFERENTIAL/PLATELET
Abs Immature Granulocytes: 0.01 10*3/uL (ref 0.00–0.07)
Basophils Absolute: 0 10*3/uL (ref 0.0–0.1)
Basophils Relative: 0 %
EOS PCT: 0 %
Eosinophils Absolute: 0 10*3/uL (ref 0.0–0.5)
HEMATOCRIT: 35.7 % — AB (ref 36.0–46.0)
HEMOGLOBIN: 10.9 g/dL — AB (ref 12.0–15.0)
Immature Granulocytes: 0 %
LYMPHS ABS: 2.5 10*3/uL (ref 0.7–4.0)
LYMPHS PCT: 33 %
MCH: 24.5 pg — ABNORMAL LOW (ref 26.0–34.0)
MCHC: 30.5 g/dL (ref 30.0–36.0)
MCV: 80.2 fL (ref 80.0–100.0)
MONO ABS: 0.7 10*3/uL (ref 0.1–1.0)
MONOS PCT: 9 %
Neutro Abs: 4.3 10*3/uL (ref 1.7–7.7)
Neutrophils Relative %: 58 %
Platelets: 367 10*3/uL (ref 150–400)
RBC: 4.45 MIL/uL (ref 3.87–5.11)
RDW: 17.2 % — ABNORMAL HIGH (ref 11.5–15.5)
WBC: 7.5 10*3/uL (ref 4.0–10.5)
nRBC: 0 % (ref 0.0–0.2)

## 2018-09-10 LAB — BLOOD GAS, VENOUS
Acid-base deficit: 0.4 mmol/L (ref 0.0–2.0)
BICARBONATE: 24 mmol/L (ref 20.0–28.0)
FIO2: 0.21
O2 SAT: 42.1 %
Patient temperature: 98.6
pCO2, Ven: 40.7 mmHg — ABNORMAL LOW (ref 44.0–60.0)
pH, Ven: 7.388 (ref 7.250–7.430)

## 2018-09-10 MED ORDER — SODIUM CHLORIDE 0.9 % IV BOLUS
1000.0000 mL | Freq: Once | INTRAVENOUS | Status: AC
  Administered 2018-09-10: 1000 mL via INTRAVENOUS

## 2018-09-10 NOTE — Discharge Instructions (Addendum)
There are no signs of serious problems at this time.  Take Tylenol every 4 hours as needed for pain or fever.  Try to drink plenty of fluids.  Use Kaopectate if needed for diarrhea.  Return here or see your doctor for problems.

## 2018-09-10 NOTE — ED Provider Notes (Signed)
[facility]-EMERGENCY DEPT Provider Note   CSN: [id number] Arrival date & time: 09/10/18  1118    History   Chief Complaint Chief Complaint  Patient presents with  . Cough  . Shortness of Breath  . Fever    HPI Katherine Casey is a 23 y.o. female.     HPI   She presents for evaluation of general weakness, tired and fatigued less.  Gradually worse over the last 2 weeks.  She went to an urgent care yesterday was tested for flu it was negative.  She has had fever on and off but is tending to improve.  She has ongoing diarrhea multiple times each day and nausea, without vomiting.  Stool is normal color.  No chronic illnesses.  Recent travel to [place], 1-1/2 weeks ago for 2 days.  No known sick contacts there.  No known exposure to Covid-19.  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  There are no other known modifying factors.  Past Medical History:  Diagnosis Date  . Herpes simplex   . Renal disorder    kidney stone    There are no active problems to display for this patient.   Past Surgical History:  Procedure Laterality Date  . BREAST SURGERY     benign tumor removal  . kidney stone removal       OB History   No obstetric history on file.      Home Medications    Prior to Admission medications   Medication Sig Start Date End Date Taking? Authorizing Provider  albuterol (PROVENTIL HFA;VENTOLIN HFA) 108 (90 Base) MCG/ACT inhaler Inhale 1-2 puffs into the lungs every 6 (six) hours as needed for wheezing or shortness of breath. 06/29/17   Wallis Bamberg, PA-C  Amphetamine-Dextroamphetamine (ADDERALL PO) Take by mouth.    [provider]  benzonatate (TESSALON) 100 MG capsule Take 1-2 capsules (100-200 mg total) by mouth 3 (three) times daily as needed for cough. 06/29/17   Wallis Bamberg, PA-C  chlorpheniramine-HYDROcodone (TUSSIONEX) 10-8 MG/5ML SUER  09/09/18   [provider]   HYDROcodone-homatropine (HYCODAN) 5-1.5 MG/5ML syrup Take 5 mLs by mouth at bedtime as needed. 06/29/17   Wallis Bamberg, PA-C  hydrOXYzine (VISTARIL) 50 MG capsule Take 1 capsule (50 mg total) by mouth 3 (three) times daily as needed. 06/29/17   Wallis Bamberg, PA-C  oxyCODONE-acetaminophen (PERCOCET) 5-325 MG tablet Take 1 tablet by mouth every 4 (four) hours as needed. 09/01/17   Pisciotta, Joni Reining, PA-C  predniSONE (STERAPRED UNI-PAK 21 TAB) 10 MG (21) TBPK tablet Take by mouth daily. Take 6 tabs by mouth daily  for 2 days, then 5 tabs for 2 days, then 4 tabs for 2 days, then 3 tabs for 2 days, 2 tabs for 2 days, then 1 tab by mouth daily for 2 days 06/27/17   Coralyn Mark, NP  predniSONE (STERAPRED UNI-PAK 21 TAB) 5 MG (21) TBPK tablet  09/09/18   [provider]  promethazine (PHENERGAN) 25 MG tablet Take 1 tablet (25 mg total) by mouth every 8 (eight) hours as needed for nausea or vomiting. 09/25/16   Lawyer, Cristal Deer, PA-C  tamsulosin (FLOMAX) 0.4 MG CAPS capsule Take 1 capsule (0.4 mg total) by mouth daily. 09/25/16   Lawyer, Cristal Deer, PA-C  tamsulosin (FLOMAX) 0.4 MG CAPS capsule Take 1 capsule (0.4 mg total) by mouth daily. 11/22/16   Trixie Dredge, PA-C  valACYclovir (VALTREX) 500 MG tablet  05/22/18  [provider]    Family History Family History  Adopted: Yes  Family history unknown: Yes    Social History Social History   Tobacco Use  . Smoking status: Never Smoker  . Smokeless tobacco: Never Used  Substance Use Topics  . Alcohol use: Yes    Comment: occasionally  . Drug use: Yes    Frequency: 7.0 times per week    Types: Marijuana     Allergies   Azithromycin   Review of Systems Review of Systems  All other systems reviewed and are negative.    Physical Exam Updated Vital Signs BP 121/76 (BP Location: Left Arm)   Pulse 72   Temp 98.7 F (37.1 C) (Oral)   Resp 18   Ht 5\' 3"  (1.6 m)   Wt 65.8 kg   LMP 08/27/2018   SpO2 100%   BMI 25.69  kg/m   Physical Exam Vitals signs and nursing note reviewed.  Constitutional:      General: She is not in acute distress.    Appearance: She is well-developed. She is not ill-appearing, toxic-appearing or diaphoretic.  HENT:     Head: Normocephalic and atraumatic.     Right Ear: External ear normal.     Left Ear: External ear normal.  Eyes:     Conjunctiva/sclera: Conjunctivae normal.     Pupils: Pupils are equal, round, and reactive to light.  Neck:     Musculoskeletal: Normal range of motion and neck supple.     Trachea: Phonation normal.  Cardiovascular:     Rate and Rhythm: Normal rate and regular rhythm.     Heart sounds: Normal heart sounds.  Pulmonary:     Effort: Pulmonary effort is normal. No respiratory distress.     Breath sounds: Normal breath sounds. No stridor. No wheezing or rhonchi.  Abdominal:     General: There is no distension.     Palpations: Abdomen is soft. There is no mass.     Tenderness: There is no abdominal tenderness. There is no guarding.  Musculoskeletal: Normal range of motion.  Skin:    General: Skin is warm and dry.  Neurological:     Mental Status: She is alert and oriented to person, place, and time.     Cranial Nerves: No cranial nerve deficit.     Sensory: No sensory deficit.     Motor: No abnormal muscle tone.     Coordination: Coordination normal.  Psychiatric:        Mood and Affect: Mood normal.        Behavior: Behavior normal.        Thought Content: Thought content normal.        Judgment: Judgment normal.      ED Treatments / Results  Labs (all labs ordered are listed, but only abnormal results are displayed) Labs Reviewed  BLOOD GAS, VENOUS - Abnormal; Notable for the following components:      Result Value   pCO2, Ven 40.7 (*)    All other components within normal limits  CBC WITH DIFFERENTIAL/PLATELET - Abnormal; Notable for the following components:   Hemoglobin 10.9 (*)    HCT 35.7 (*)    MCH 24.5 (*)    RDW  17.2 (*)    All other components within normal limits  COMPREHENSIVE METABOLIC PANEL    EKG None  Radiology Dg Chest 2 View  Result Date: 09/10/2018 CLINICAL DATA:  Productive cough. EXAM: CHEST - 2 VIEW COMPARISON:  No recent.  FINDINGS: Mediastinum hilar structures normal. Lungs are clear. No pleural effusion or pneumothorax. Heart size normal. Thoracic spine mild scoliosis. IMPRESSION: No acute cardiopulmonary disease. Electronically Signed   By: Maisie Fus  Register   On: 09/10/2018 12:25    Procedures Procedures (including critical care time)  Medications Ordered in ED Medications  sodium chloride 0.9 % bolus 1,000 mL (1,000 mLs Intravenous New Bag/Given 09/10/18 1301)     Initial Impression / Assessment and Plan / ED Course  I have reviewed the triage vital signs and the nursing notes.  Pertinent labs & imaging results that were available during my care of the patient were reviewed by me and considered in my medical decision making (see chart for details).  Clinical Course as of Sep 10 1407  Thu Sep 10, 2018  1333 Normal except hemoglobin low, MCH low  CBC with Differential(!) [EW]  1334 Normal except PCO2 low  Blood gas, venous(!) [EW]  1334 No infiltrate or CHF, images reviewed by me  DG Chest 2 View [EW]  1355 Normal  Comprehensive metabolic panel [EW]    Clinical Course User Index [EW] Mancel Bale, MD        Patient Vitals for the past 24 hrs:  BP Temp Temp src Pulse Resp SpO2 Height Weight  09/10/18 1336 121/76 98.7 F (37.1 C) Oral 72 18 100 % - -  09/10/18 1315 - - - 69 - 100 % - -  09/10/18 1125 - - - - - - 5\' 3"  (1.6 m) 65.8 kg  09/10/18 1123 125/85 98.4 F (36.9 C) Oral (!) 110 20 98 % - -    1:56 PM Reevaluation with update and discussion. After initial assessment and treatment, an updated evaluation reveals she reports she is feeling better at this time and has no further complaints.  Findings discussed and questions answered. Mancel Bale    Medical Decision Making: Patient presenting with smoldering illness, possibly viral, without known exposure or high risk contact for novel Covid-19.  She tested negative for influenza, yesterday.  Screening evaluation in the ED is reassuring.  CRITICAL CARE-no Performed by: Mancel Bale  Nursing Notes Reviewed/ Care Coordinated Applicable Imaging Reviewed Interpretation of Laboratory Data incorporated into ED treatment  The patient appears reasonably screened and/or stabilized for discharge and I doubt any other medical condition or other [facility] requiring further screening, evaluation, or treatment in the ED at this time prior to discharge.  Plan: Home Medications-OTC antipyretic as needed; Home Treatments-gradually advance diet and activity; return here if the recommended treatment, does not improve the symptoms; Recommended follow up-PCP or return here as needed.   Final Clinical Impressions(s) / ED Diagnoses   Final diagnoses:  Viral upper respiratory tract infection    ED Discharge Orders    None       Mancel Bale, MD 09/10/18 1409

## 2018-09-10 NOTE — ED Triage Notes (Signed)
Patient reports that she has had a productive cough with yellow/green sputum, fever, and SOB x 2 weeks. [facility] went [facility] and was told she had bronchitis and told her she was getting over the flu. Patient states she came today because she does not feel like she is getting any better and feels tired.

## 2018-10-11 IMAGING — CR DG FOOT COMPLETE 3+V*L*
3 series · 3 of 3 positions shown · non-contrast
Comparison: None.

CLINICAL DATA: Fall, left ankle pain/swelling

EXAM:
LEFT FOOT - COMPLETE 3+ VIEW

[x foot ap left]
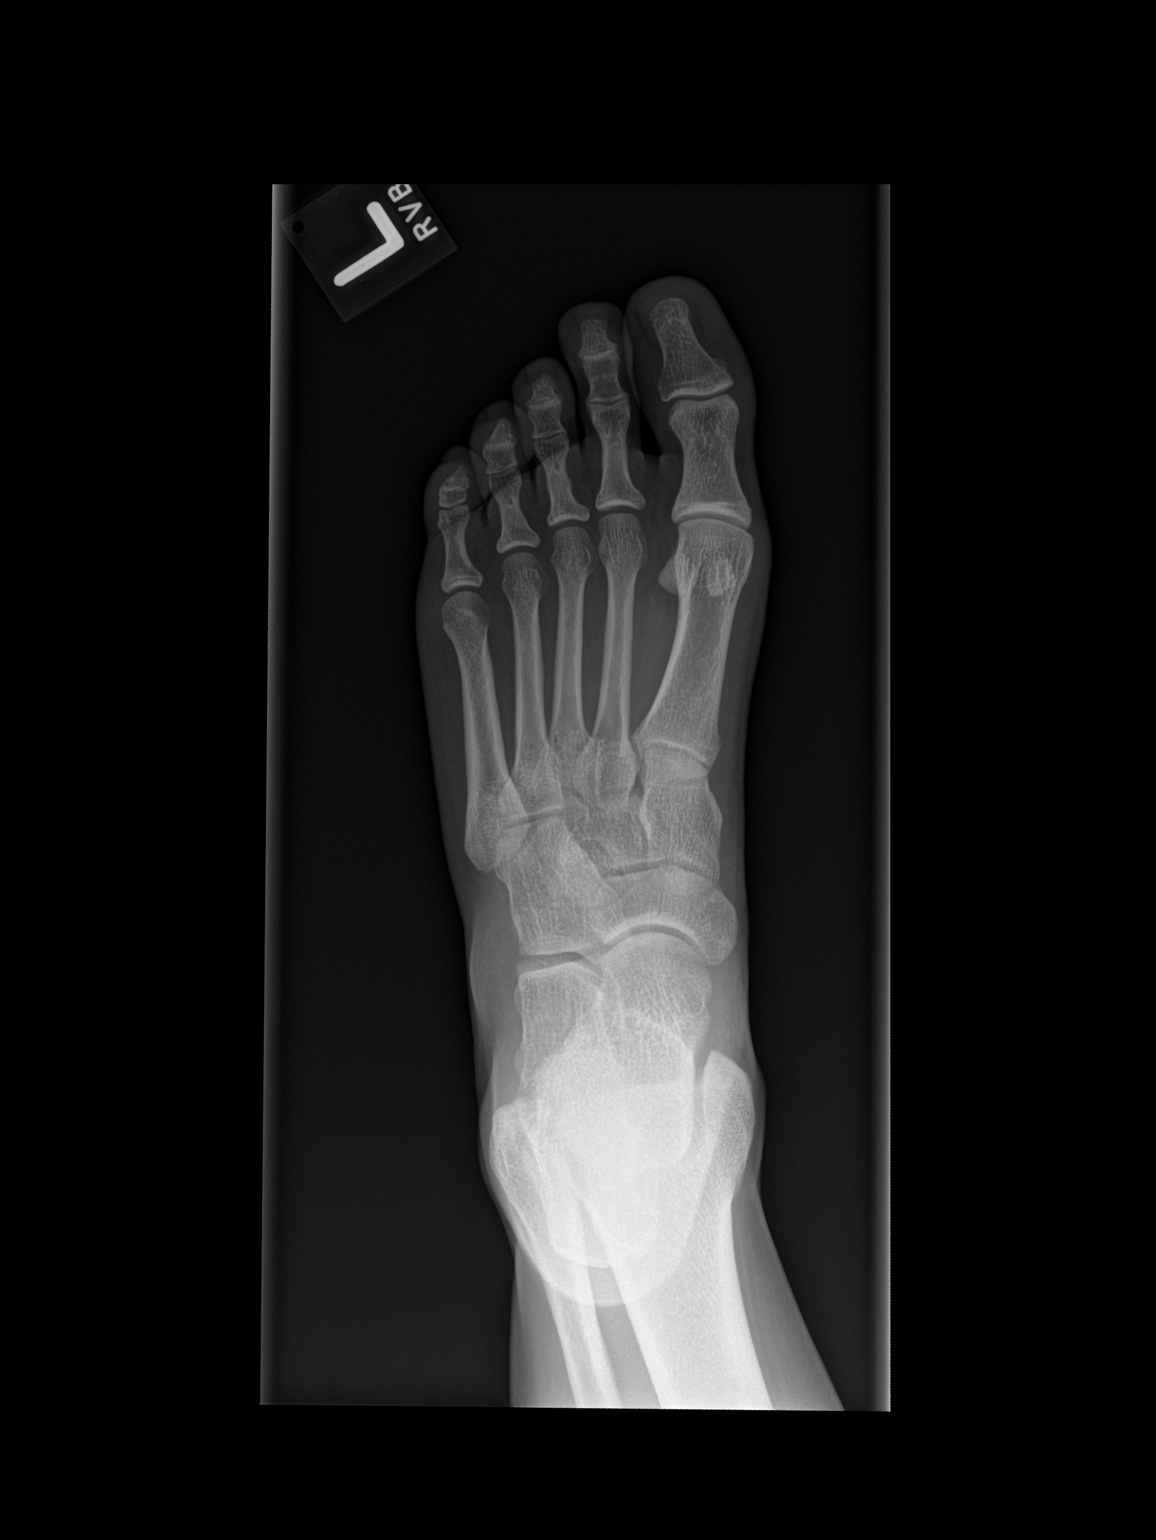

[x foot obl left]
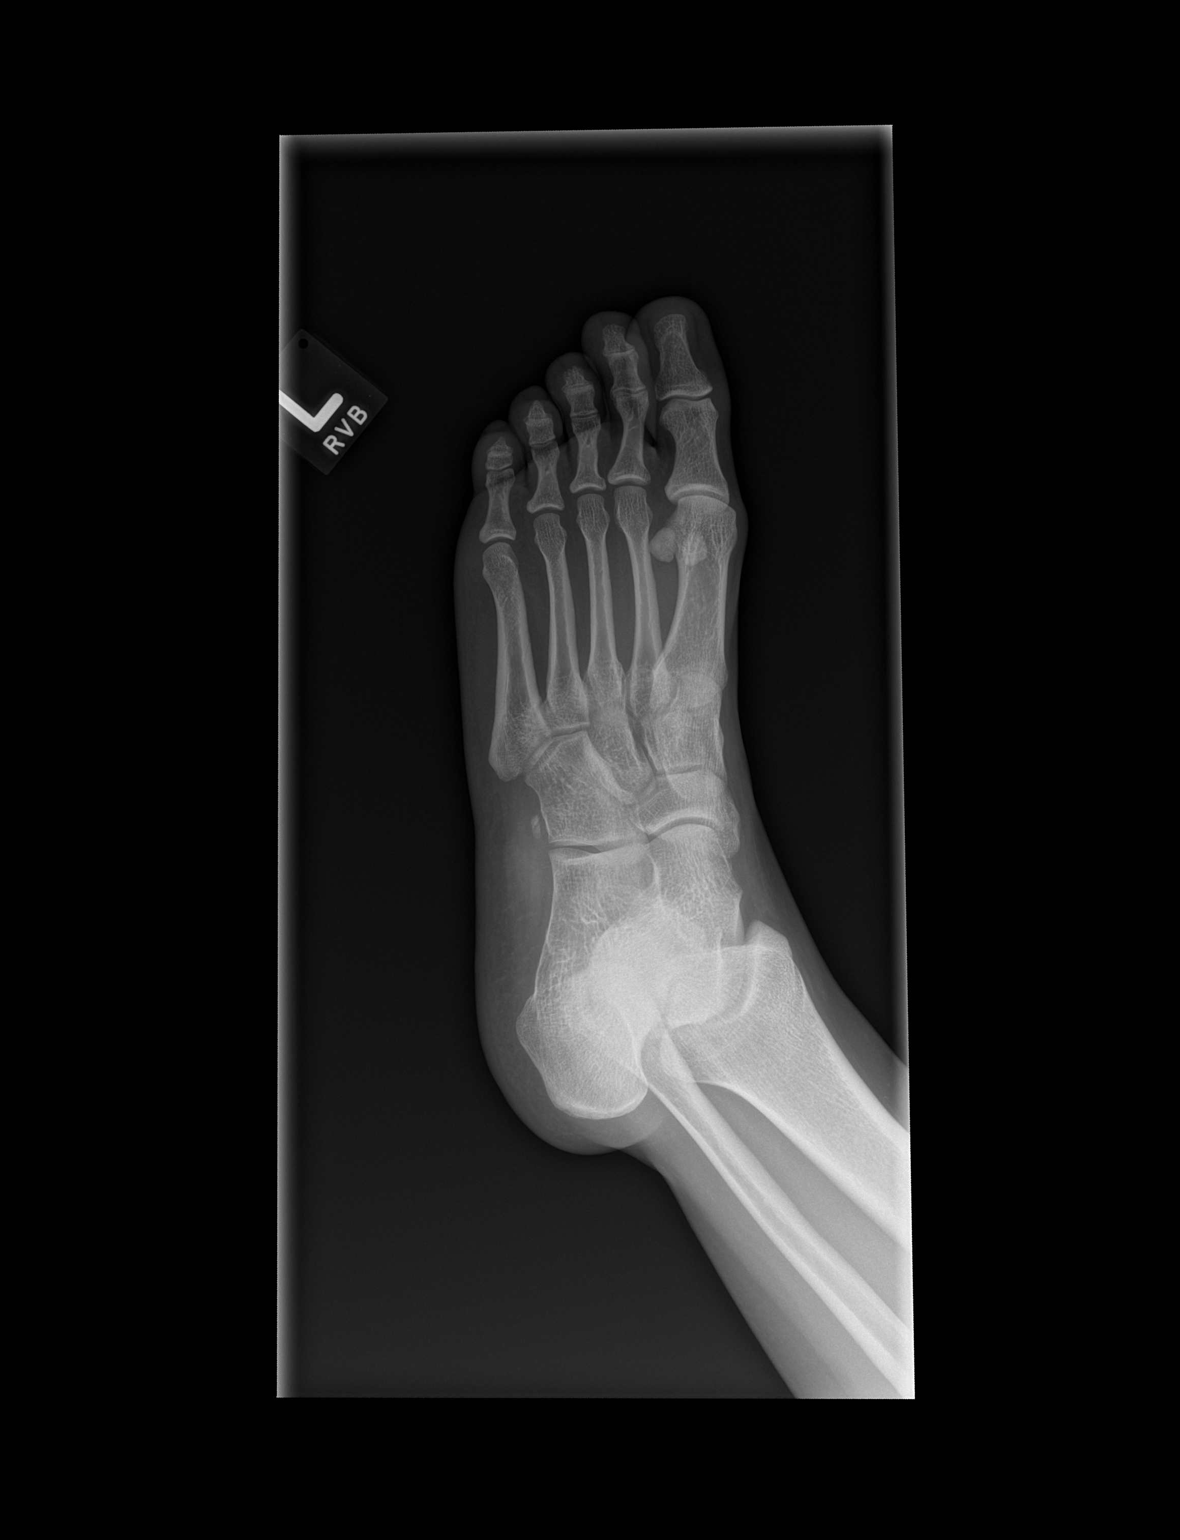

[x foot lat left]
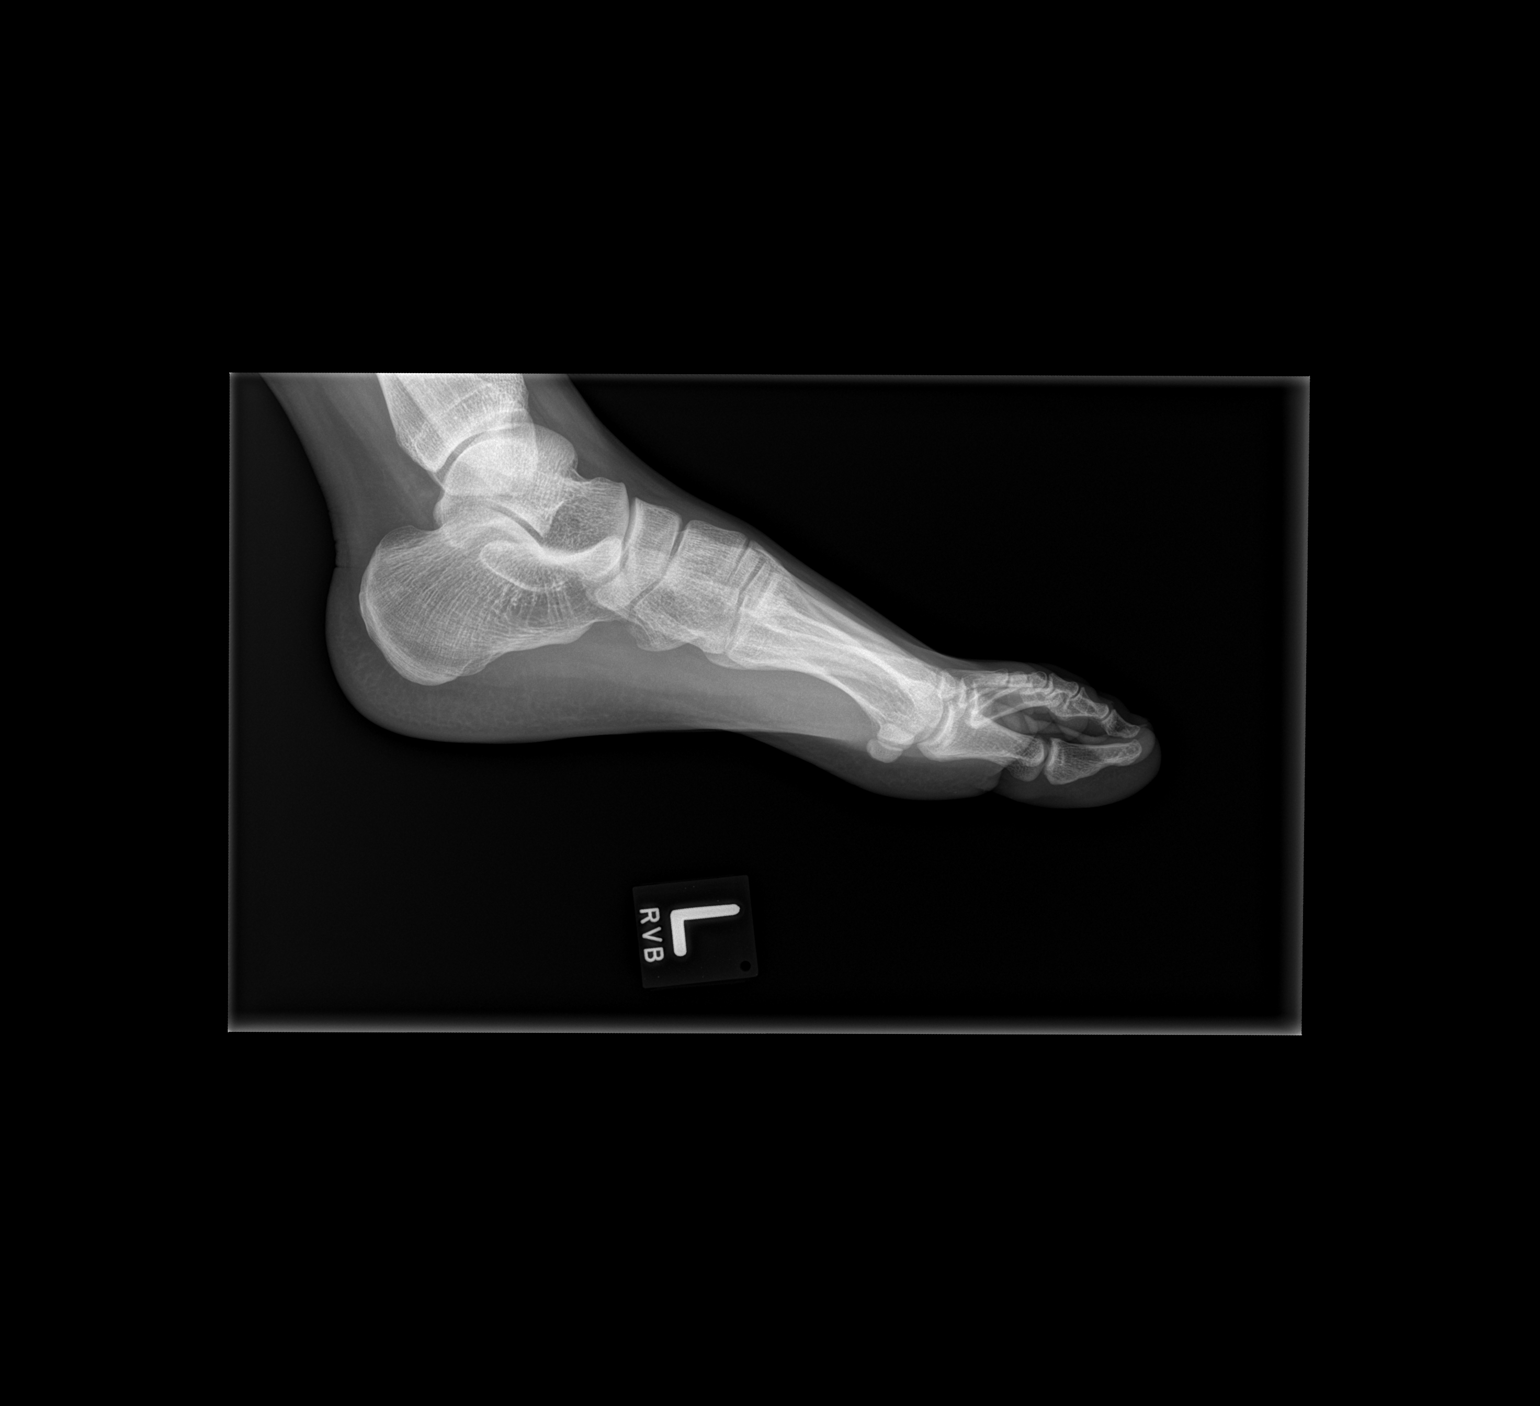

[3 of 3 positions shown; findings below may reference images not displayed]

FINDINGS: No fracture or dislocation is seen.

The joint spaces are preserved.

The visualized soft tissues are unremarkable.
IMPRESSION: Negative.

## 2020-04-18 ENCOUNTER — Ambulatory Visit: Payer: Self-pay

## 2020-12-27 ENCOUNTER — Emergency Department (HOSPITAL_COMMUNITY)
Admission: EM | Admit: 2020-12-27 | Discharge: 2020-12-27 | Disposition: A | Discharge status: Home / Self Care | Payer: Managed Care, Other (non HMO) | Attending: Emergency Medicine | Admitting: Emergency Medicine

## 2020-12-27 ENCOUNTER — Other Ambulatory Visit: Payer: Self-pay

## 2020-12-27 ENCOUNTER — Encounter (HOSPITAL_COMMUNITY): Payer: Self-pay

## 2020-12-27 DIAGNOSIS — M5412 Radiculopathy, cervical region: Secondary | ICD-10-CM | POA: Diagnosis not present

## 2020-12-27 DIAGNOSIS — M542 Cervicalgia: Secondary | ICD-10-CM | POA: Diagnosis present

## 2020-12-27 MED ORDER — NAPROXEN 500 MG PO TABS
500.0000 mg | ORAL_TABLET | Freq: Once | ORAL | Status: AC
  Administered 2020-12-27: 500 mg via ORAL
  Filled 2020-12-27: qty 1

## 2020-12-27 MED ORDER — CYCLOBENZAPRINE HCL 10 MG PO TABS: 10.0000 mg | ORAL_TABLET | Freq: Three times a day (TID) | ORAL | 0 refills | Status: AC | PRN

## 2020-12-27 MED ORDER — CYCLOBENZAPRINE HCL 10 MG PO TABS
10.0000 mg | ORAL_TABLET | Freq: Once | ORAL | Status: AC
  Administered 2020-12-27: 10 mg via ORAL
  Filled 2020-12-27: qty 1

## 2020-12-27 MED ORDER — NAPROXEN 375 MG PO TABS: ORAL_TABLET | ORAL | 0 refills | Status: AC

## 2020-12-27 NOTE — ED Provider Notes (Signed)
[facility]-EMERGENCY DEPT Provider Note: Katherine Dell, MD, FACEP  CSN: [id number] MRN: [id number] ARRIVAL: 12/27/20 at 0545 ROOM: [facility]   CHIEF COMPLAINT  Back Pain and Shoulder Pain   HISTORY OF PRESENT ILLNESS  12/27/20 6:15 AM Katherine Casey is a 25 y.o. female who was helping her father move bricks yesterday.  She did not injure herself or noticed any pain yesterday.  This morning she developed pain on the left side of her neck radiating to the left upper back and towards her shoulder.  She rates it as an 8 out of 10 with both aching and sharp components.  It is worse with leaning her head to the right or backwards.  It is not worse with movement of the left shoulder.  She is having no numbness with this.   Past Medical History:  Diagnosis Date   Herpes simplex    Renal disorder    kidney stone    Past Surgical History:  Procedure Laterality Date   BREAST SURGERY     benign tumor removal   kidney stone removal      Family History  Adopted: Yes  Family history unknown: Yes    Social History   Tobacco Use   Smoking status: Never   Smokeless tobacco: Never  Vaping Use   Vaping Use: Never used  Substance Use Topics   Alcohol use: Yes    Comment: occasionally   Drug use: Yes    Frequency: 7.0 times per week    Types: Marijuana    Prior to Admission medications   Medication Sig Start Date End Date Taking? Authorizing Provider  cyclobenzaprine (FLEXERIL) 10 MG tablet Take 1 tablet (10 mg total) by mouth 3 (three) times daily as needed for muscle spasms. 12/27/20  Yes Kailei Cowens, MD  naproxen (NAPROSYN) 375 MG tablet Take 1 tablet twice daily as needed for neck pain. 12/27/20  Yes Mandela Bello, MD  albuterol (PROVENTIL HFA;VENTOLIN HFA) 108 (90 Base) MCG/ACT inhaler Inhale 1-2 puffs into the lungs every 6 (six) hours as needed for wheezing or shortness of breath. 06/29/17   Wallis Bamberg, PA-C  Amphetamine-Dextroamphetamine (ADDERALL PO) Take by mouth.    [provider]  valACYclovir (VALTREX) 500 MG tablet  05/22/18   [provider]    Allergies Azithromycin   REVIEW OF SYSTEMS  Negative except as noted here or in the History of Present Illness.   PHYSICAL EXAMINATION  Initial Vital Signs Blood pressure 116/67, pulse 71, temperature 98.4 F (36.9 C), temperature source Oral, resp. rate 18, height 5\' 3"  (1.6 m), weight 70.3 kg, SpO2 100 %.  Examination General: Well-developed, well-nourished female in no acute distress; appearance consistent with age of record HENT: normocephalic; atraumatic Eyes: Normal appearance Neck: supple; leaning her head to the right or backwards exacerbates pain; no trapezius muscle tenderness Heart: regular rate and rhythm Lungs: clear to auscultation bilaterally Abdomen: soft; nondistended; nontender; bowel sounds present Back: Upper left paraspinal soft tissue tenderness Extremities: No deformity; full range of motion; pulses normal; no pain on movement of left shoulder Neurologic: Awake, alert and oriented; motor function intact in all extremities and symmetric; no facial droop Skin: Warm and dry Psychiatric: Normal mood and affect   RESULTS  Summary of this visit's results, reviewed and interpreted by myself:   EKG Interpretation  Date/Time:    Ventricular Rate:    PR Interval:    QRS Duration:   QT Interval:    QTC Calculation:   R Axis:  Text Interpretation:          Laboratory Studies: No results found for this or any previous visit (from the past 24 hour(s)). Imaging Studies: No results found.  ED COURSE and MDM  Nursing notes, initial and subsequent vitals signs, including pulse oximetry, reviewed and interpreted by myself.  Vitals:   12/27/20 0559 12/27/20 0600  BP: 116/67   Pulse: 71   Resp: 18   Temp: 98.4 F (36.9 C)   TempSrc: Oral   SpO2: 100%   Weight:  70.3 kg  Height:  5\' 3"  (1.6 m)   Medications  naproxen (NAPROSYN) tablet 500 mg (has no  administration in time range)  cyclobenzaprine (FLEXERIL) tablet 10 mg (has no administration in time range)    The pain appears to be related primarily to the patient's neck.  This could represent a cervical radiculopathy although there is no radiation to the left arm past the shoulder.  She was advised that since pain in a person her age is likely self-limited and we will treat her conservatively with an NSAID and Flexeril.  PROCEDURES  Procedures   ED DIAGNOSES     ICD-10-CM   1. Cervical radiculopathy  M54.12          Scottie Stanish, , MD 12/27/20 [phone]

## 2020-12-27 NOTE — ED Triage Notes (Signed)
Pt complains of back pain and left shoulder pain that occurred after helping her dad yesterday.

## 2021-01-11 ENCOUNTER — Encounter (HOSPITAL_COMMUNITY): Payer: Self-pay
